# Patient Record
Sex: Female | Born: 1965 | Race: White | Hispanic: No | Marital: Married | Smoking: Never smoker
Health system: Southern US, Community
[De-identification: ages and names within clinical notes are randomized; demographics above are authoritative.]

## PROBLEM LIST (undated history)

## (undated) DIAGNOSIS — Z681 Body mass index (BMI) 19 or less, adult: Secondary | ICD-10-CM

## (undated) DIAGNOSIS — R079 Chest pain, unspecified: Secondary | ICD-10-CM

## (undated) HISTORY — PX: FOOT SURGERY: SHX648

## (undated) HISTORY — DX: Body mass index (BMI) 19.9 or less, adult: Z68.1

## (undated) HISTORY — DX: Chest pain, unspecified: R07.9

---

## 1999-07-22 ENCOUNTER — Other Ambulatory Visit: Admission: RE | Admit: 1999-07-22 | Discharge: 1999-07-22 | Payer: Self-pay | Admitting: Obstetrics & Gynecology

## 2001-04-18 ENCOUNTER — Other Ambulatory Visit: Admission: RE | Admit: 2001-04-18 | Discharge: 2001-04-18 | Payer: Self-pay | Admitting: Obstetrics & Gynecology

## 2002-05-29 ENCOUNTER — Other Ambulatory Visit: Admission: RE | Admit: 2002-05-29 | Discharge: 2002-05-29 | Payer: Self-pay | Admitting: Obstetrics & Gynecology

## 2003-08-20 ENCOUNTER — Other Ambulatory Visit: Admission: RE | Admit: 2003-08-20 | Discharge: 2003-08-20 | Payer: Self-pay | Admitting: Obstetrics & Gynecology

## 2003-08-20 ENCOUNTER — Other Ambulatory Visit: Admission: RE | Admit: 2003-08-20 | Discharge: 2003-08-20 | Payer: Self-pay | Admitting: Obstetrics and Gynecology

## 2003-11-26 ENCOUNTER — Other Ambulatory Visit: Admission: RE | Admit: 2003-11-26 | Discharge: 2003-11-26 | Payer: Self-pay | Admitting: Obstetrics & Gynecology

## 2004-03-16 ENCOUNTER — Ambulatory Visit (HOSPITAL_COMMUNITY): Admission: RE | Admit: 2004-03-16 | Discharge: 2004-03-16 | Payer: Self-pay | Admitting: Internal Medicine

## 2004-07-05 ENCOUNTER — Other Ambulatory Visit: Admission: RE | Admit: 2004-07-05 | Discharge: 2004-07-05 | Payer: Self-pay | Admitting: Obstetrics & Gynecology

## 2004-10-03 ENCOUNTER — Ambulatory Visit (HOSPITAL_COMMUNITY): Admission: RE | Admit: 2004-10-03 | Discharge: 2004-10-03 | Payer: Self-pay | Admitting: Family Medicine

## 2005-01-10 ENCOUNTER — Other Ambulatory Visit: Admission: RE | Admit: 2005-01-10 | Discharge: 2005-01-10 | Payer: Self-pay | Admitting: Obstetrics and Gynecology

## 2005-01-11 ENCOUNTER — Other Ambulatory Visit: Admission: RE | Admit: 2005-01-11 | Discharge: 2005-01-11 | Payer: Self-pay | Admitting: Obstetrics and Gynecology

## 2005-12-15 ENCOUNTER — Inpatient Hospital Stay (HOSPITAL_COMMUNITY): Admission: AD | Admit: 2005-12-15 | Discharge: 2005-12-15 | Payer: Self-pay | Admitting: Internal Medicine

## 2006-01-02 ENCOUNTER — Ambulatory Visit: Payer: Self-pay | Admitting: Internal Medicine

## 2006-02-06 ENCOUNTER — Ambulatory Visit: Payer: Self-pay | Admitting: Internal Medicine

## 2007-03-28 ENCOUNTER — Ambulatory Visit (HOSPITAL_COMMUNITY): Admission: RE | Admit: 2007-03-28 | Discharge: 2007-03-28 | Payer: Self-pay | Admitting: Family Medicine

## 2009-01-23 IMAGING — CT CT ABDOMEN W/ CM
1 of 3 series · 13 of 32 positions shown, 18 images · IV contrast (Omnipaque 300)
Comparison: 12/15/2005 CT.

CLINICAL DATA: Left lower quadrant pain, bloating, swelling.  History of diverticulitis.  Acute abdominal pain, evaluate for perforation.
ABDOMEN CT WITH CONTRAST:
TECHNIQUE: Multidetector CT imaging of the abdomen was performed following the standard protocol during bolus administration of intravenous contrast.
Contrast:  522cc Omnipaque 300 and oral contrast media.
TECHNIQUE: Multidetector CT imaging of the pelvis was performed following the standard protocol during bolus administration of intravenous contrast.

[Series 2: abd_pel 5.0 b40f · axial · 0.64mm/px · z∈[-488,-68]mm · 13 of 96 slices shown, 18 images]
[im 6/96  soft-tissue]
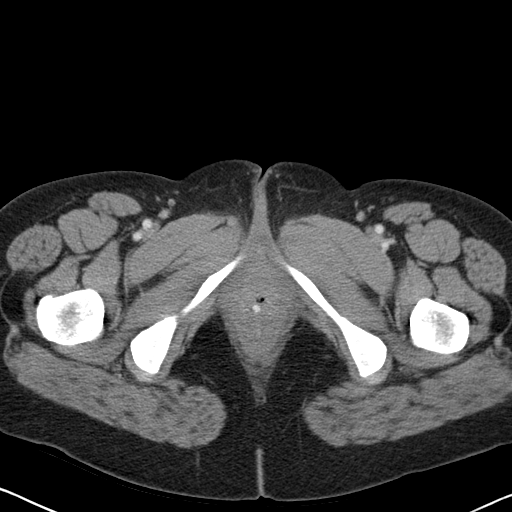
[im 6/96  bone]
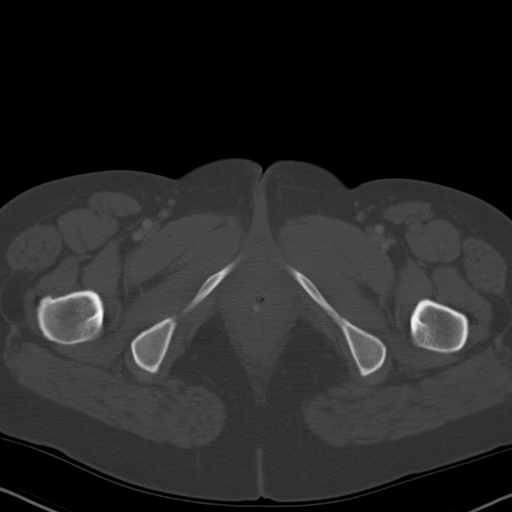
[im 16/96  soft-tissue]
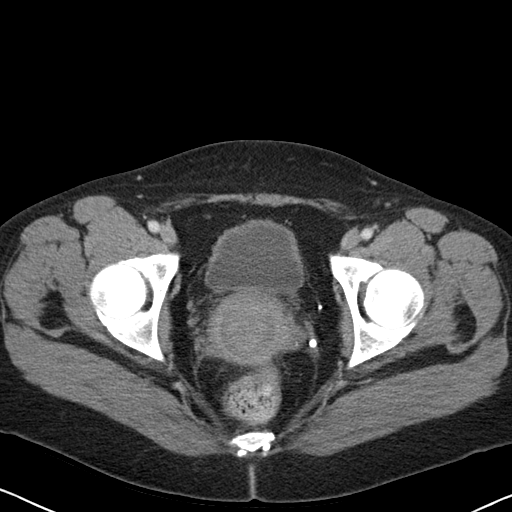
[im 22/96  soft-tissue]
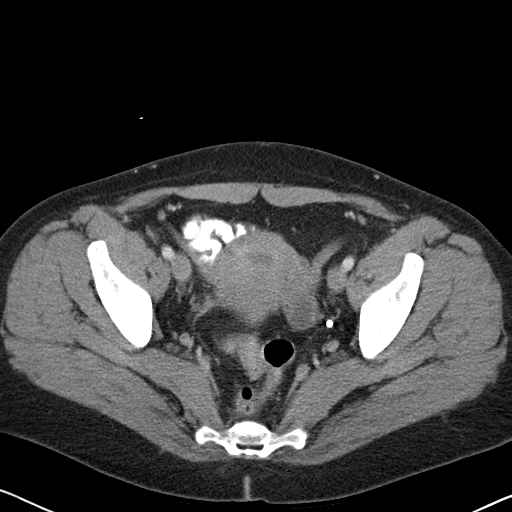
[im 27/96  soft-tissue]
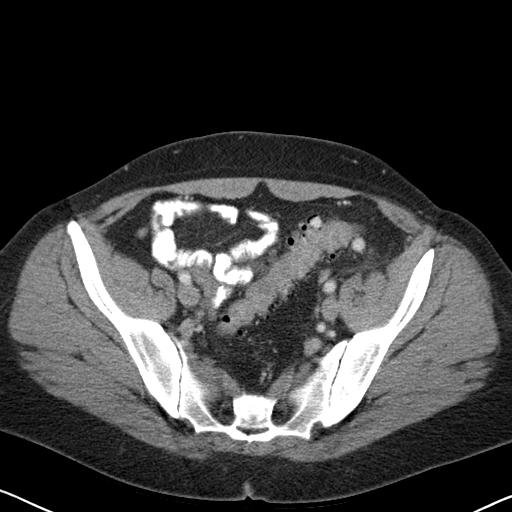
[im 37/96  soft-tissue]
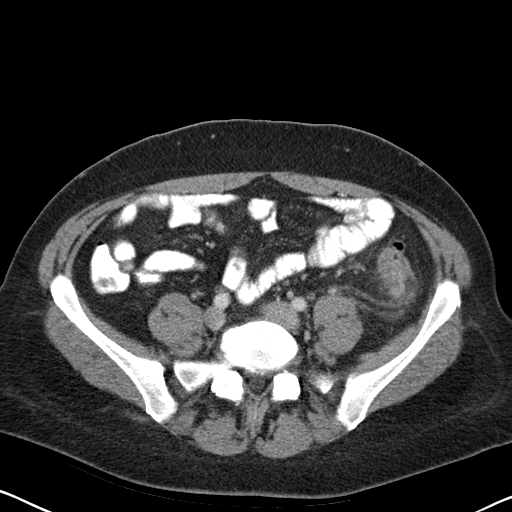
[im 43/96  soft-tissue]
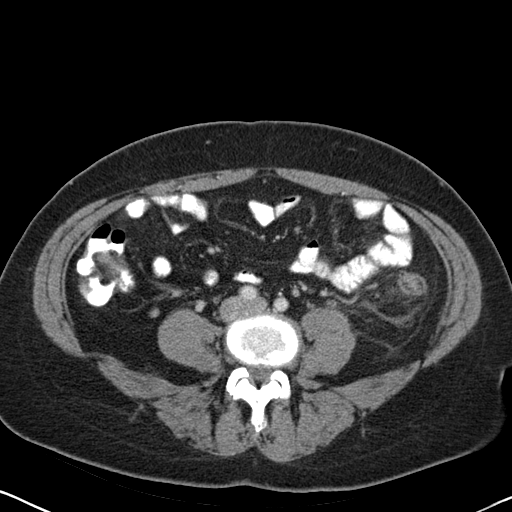
[im 53/96  soft-tissue]
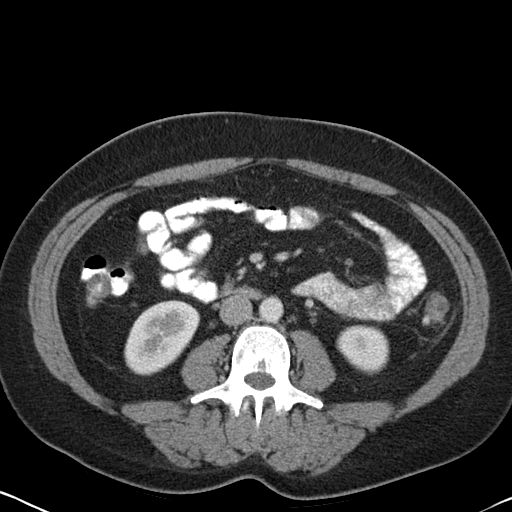
[im 59/96  soft-tissue]
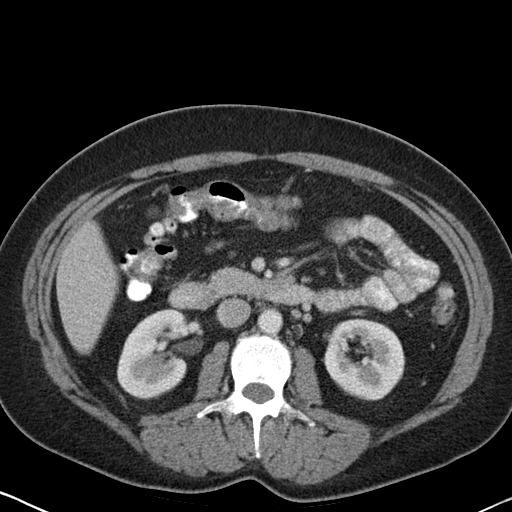
[im 69/96  soft-tissue]
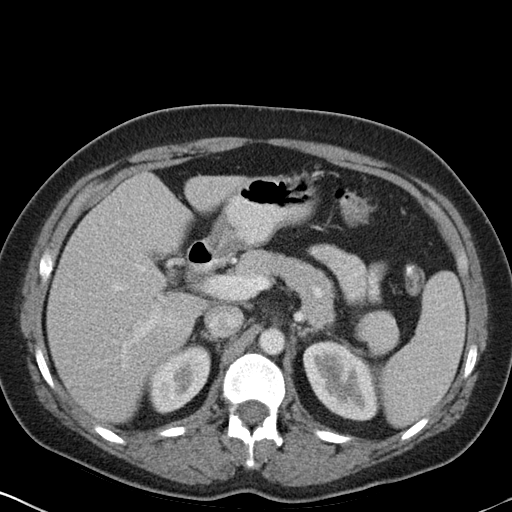
[im 69/96  bone]
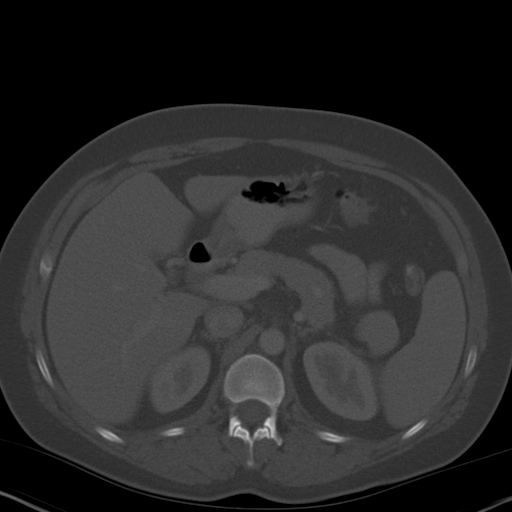
[im 74/96  soft-tissue]
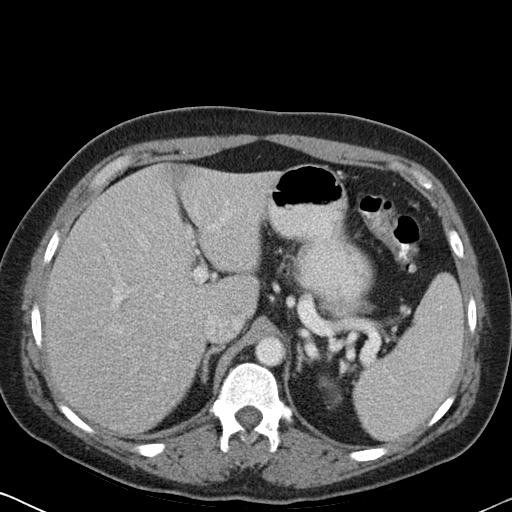
[im 74/96  lung]
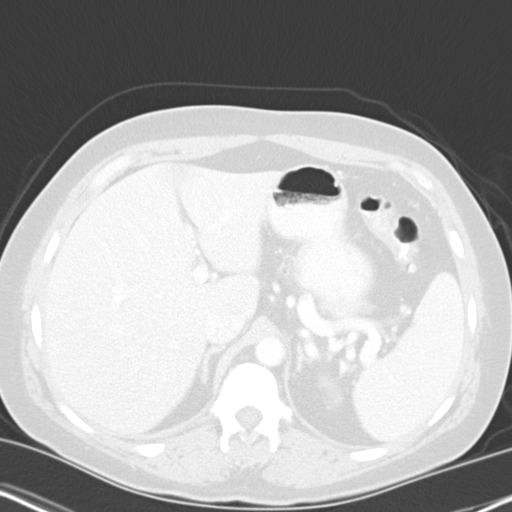
[im 80/96  soft-tissue]
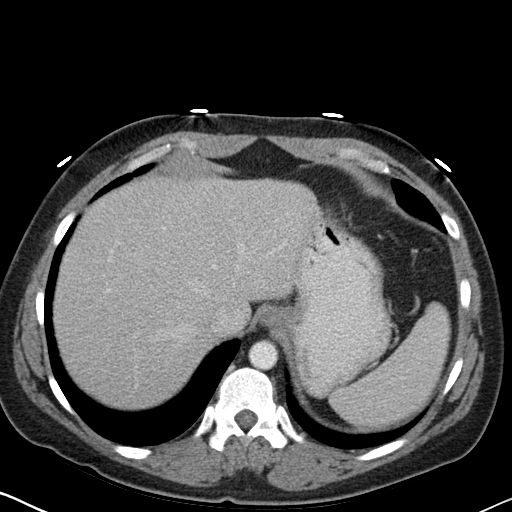
[im 80/96  lung]
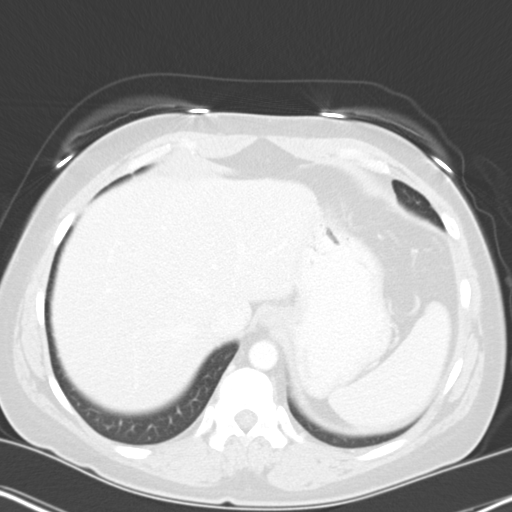
[im 85/96  lung]
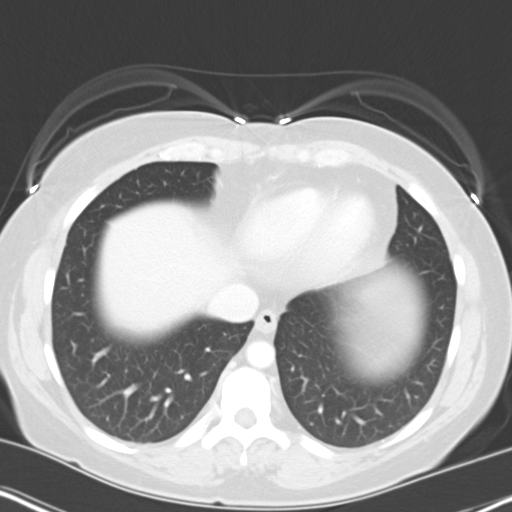
[im 90/96  soft-tissue]
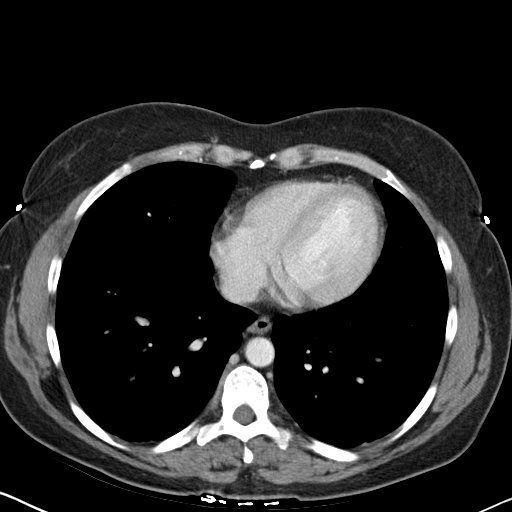
[im 90/96  lung]
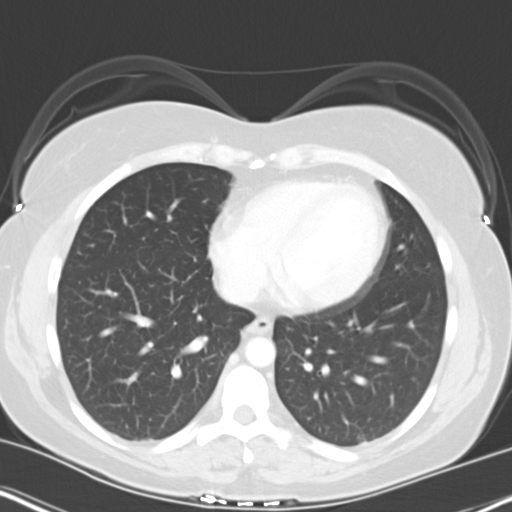

[13 of 32 positions shown; findings below may reference images not displayed]

FINDINGS: Again appreciated is 9-10mm cyst in the superior and anterior aspect of the liver and a vague focus of contrast enhancement in the inferior aspect of the right hepatic lobe measuring approximately 2.5-3.2 x 2.6cm compared to 2.2 x 2.2cm on the prior exam.  ndings compatible with acute diverticulitis involving the distal descending colon.  There is wall-thickening and pericolic edema.  There is thickening of the nearby pararenal fascia.  No evidence of a pericolic abscess or free fluid.  No free intraperitoneal air.  Scattered diverticula in the transverse descending and sigmoid colon are noted.
IMPRESSION: Acute diverticulitis of the distal descending colon.  Slight increase in size of mass in the inferior aspect of the right hepatic lobe.  Main differential diagnostic considerations are focal nodular hyperplasia and hepatic adenoma.
PELVIS CT WITH CONTRAST:
FINDINGS: Sigmoid colon diverticula.  Again appreciated is diverticulitis involving distal descending colon, the distal aspect of which is near the proximal aspect of the sigmoid colon.  1.9 x 2.4cm left ovarian cyst.  Approximate 9mm follicle/follicular cyst in the right ovary.  No free pelvic fluid.  I am concerned that there is a mass in the cervix vs. distention of the endocervical canal.  There is an inhomogeneous attenuation of this region. This could be due to blood/debris in the cervical canal, or possibly a tumor.  There appears to be slight stranding and increased vasculature surrounding the cervix.  Cervicitis would be a consideration as well.
IMPRESSION: Distal descending colon diverticulitis.  Suspicion for mass within the cervix vs. distention of endocervical canal.  Cannot exclude tumor vs. inflammatory process.  Consider pelvic ultrasound.

## 2010-08-05 NOTE — Discharge Summary (Signed)
NAME:  Whitney Cook, Whitney Cook                 ACCOUNT NO.:  [id number]   MEDICAL RECORD NO.:  [id number]          PATIENT TYPE:  INP   LOCATION:  A338                          FACILITY:  [facility]   PHYSICIAN:  Madelin Rear. Sherwood Gambler, MD  DATE OF BIRTH:  07/05/1965   DATE OF ADMISSION:  12/15/2005  DATE OF DISCHARGE:  09/28/2007LH                                 DISCHARGE SUMMARY   DISCHARGE DIAGNOSIS:  Acute diverticulitis.   DISCHARGE MEDICATIONS:  1. Levaquin 500 mg p.o. daily.  2. Cleocin 300 mg p.o. q.i.d.   SUMMARY:  The patient was admitted after evaluation as an outpatient  confirmed clinical impression of diverticulitis, treated with oral  medications.  She suffered from an adverse reaction to FLAGYL.  Pain  persisted and she was somewhat anorexic.  She was admitted for IV antibiotic  therapy and IV hydration.  Shortly after admission she felt much better  after some fluids and Zofran were given.  Also, metabolism with the Flagyl  that may have been causing her initial symptoms of dizziness and nausea.  She decided to be discharged shortly thereafter.  I concurred with this.  Follow-up in office.      Madelin Rear. Sherwood Gambler, MD  Electronically Signed     LJF/MEDQ  D:  01/14/2006  T:  01/15/2006  Job:  [id number]

## 2010-08-05 NOTE — Assessment & Plan Note (Signed)
[facility]                           GASTROENTEROLOGY OFFICE NOTE   NAME:Whitney Cook, Whitney Cook                        MRN:          [id number]  DATE:01/02/2006                            DOB:          09-12-1965   OFFICE CONSULTATION NOTE:   REFERRING PHYSICIAN:  Madelin Rear. Sherwood Gambler, MD   REASON FOR CONSULTATION:  1. Diverticulitis.  2. Liver lesions on CT scan.   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 Sherwood Gambler  regarding recent problems with diverticulitis as well as hepatic  abnormalities on CT scan imaging. The patient has no significant medical  problems. She was well until last month when she developed problems with  left chest pain, right flank pain and ultimately generalized abdominal  discomfort. She described nausea and bloating. She was evaluated December 11, 2005. CBC, comprehensive metabolic panel, amylase, lipase and urinalysis  were unrevealing. A CT scan of the abdomen and pelvis revealed mild  diverticulitis in the region of the splenic flexure as well. A small hepatic  cyst in the right liver. She was treated with ciprofloxacin and Flagyl. She  developed drug intolerance in the form of nausea and vomiting, nonresponsive  to Phenergan. Medications were changed to Cleocin and Levaquin. Still with  symptoms necessitating a short course of intravenous therapy. Followup CT  scan was performed December 15, 2005. This again revealed diverticulitis in  the region of the splenic flexure without complicating features. A tiny cyst  in the left lobe of the liver was noted. In addition, she was said to have  an enhancing lesion in the right lobe of the liver felt to represent either  an adenoma or focal nodular hyperplasia. This was said to measure 2.2 x 2.2  cm. MRI of the liver was recommended. This was performed December 28, 2005 at  [facility]. A tiny 6 mm cyst in the left liver was noted. However, no  discernable lesion in the right liver as noted on CT could identified. The  patient completed a course of antimicrobial therapy and for the most part  has improved. She denies currently any abdominal pain or fevers. Occasional  discomfort with meals. She has not had prior GI evaluations. No prior  history of the same. Denies rectal bleeding or unexplained weight loss.   PAST MEDICAL HISTORY:  None.   PAST SURGICAL HISTORY:  None.   CURRENT MEDICATIONS:  Birth control pills.   ALLERGIES:  Intolerant to ORAL FLAGYL.   FAMILY HISTORY:  No family history of gastrointestinal malignancy.  Grandfather with diverticulitis. Father with heart disease and diabetes.   SOCIAL HISTORY:  The patient is married with one son. She lives with her  husband. She attended college for one year. She is the Print production planner at  [employer]. She does not smoke or use alcohol.   REVIEW OF SYSTEMS:  Per diagnostic evaluation form.   PHYSICAL EXAMINATION:  GENERAL:  Well-appearing female in no acute distress.  VITAL SIGNS:  Blood pressure is 122/80,  heart rate 60 and regular, weighs  169.2 pounds. She is 5 feet 7 inches in height.  HEENT:  Sclera anicteric, conjunctiva pink, oral mucosa intact, no  adenopathy.  LUNGS:  Clear.  HEART:  Regular.  ABDOMEN:  Soft with some tenderness in the left mid abdomen to palpation.  Good bowel sounds heard, no mass or rebound.  EXTREMITIES:  Without edema.   IMPRESSION:  1. Acute diverticulitis. Significantly improved after antibiotic therapy.      Some residual tenderness on physical examination today as well as some      postprandial pain continuing.  2. Hepatic abnormalities on CT scan. It appears that she has an incident      small benign cyst in the left liver and possibly the right liver as      well. However, lesion eluded to on her second CT scan not seen on the      MRI  probably represents focal nodular hyperplasia given the      indiscernability on MRI. Will review this with our radiologist in the      near future.   RECOMMENDATIONS:  1. Additional antibiotics for what seems to be residual mild      diverticulitis. Will prescribe Augmentin 875 mg b.i.d. for 1 week.  2. Schedule colonoscopy about 3-4 weeks after complete recovery from      diverticular episode. This to exclude other problems masquerading as      diverticulitis. The nature of the procedure as well as the risks,      benefits, and alternatives have been reviewed. She understood and      agreed to proceed. The exam has been set for November 20 at 9:30 a.m.  3. Review CT scan with radiologist.            ______________________________  Wilhemina Bonito. Eda Keys., MD     JNP/MedQ  DD:  01/08/2006  DT:  01/09/2006  Job #:  [id number]   cc:   Madelin Rear. Sherwood Gambler, MD

## 2010-08-05 NOTE — H&P (Signed)
NAME:  Whitney Cook, Whitney Cook                 ACCOUNT NO.:  [id number]   MEDICAL RECORD NO.:  [id number]          PATIENT TYPE:  INP   LOCATION:  A338                          FACILITY:  [facility]   PHYSICIAN:  Madelin Rear. Sherwood Gambler, MD  DATE OF BIRTH:  11-14-1965   DATE OF ADMISSION:  12/15/2005  DATE OF DISCHARGE:  LH                                HISTORY & PHYSICAL   CHIEF COMPLAINT:  Abdominal pain.   HISTORY OF PRESENT ILLNESS:  The patient had a recent outpatient diagnosis  made of mild diverticulitis.  Over the previous 48 hours, she has been  treated with oral regimens including Flagyl plus Cipro changed to Cleocin  plus Levaquin secondary to intolerance to Flagyl.  She has had intermittent  vertigo, nausea, vomiting, and abdominal pain.  No fever, rigors, or chills.  She has, however, had progressive increase in her left lower quadrant  abdominal pain in spite of adequate outpatient oral therapy.   PAST MEDICAL HISTORY:  Noncontributory.  She has had migraines and previous  episodes of severe vertigo.   SOCIAL HISTORY:  Nonsmoker and nondrinker.  She is our Print production planner.   FAMILY HISTORY:  Noncontributory.   REVIEW OF SYSTEMS:  Under HPI.  No hematemesis, hematochezia, or melena.   PHYSICAL EXAMINATION:  GENERAL:  Appears distressed and in moderate illness  but not acute.  HEAD/NECK:  No JVD or adenopathy.  Neck supple.  CHEST:  Clear.  CARDIAC:  Regular rhythm.  No murmur, gallop, or rub.  ABDOMEN:  Positive left lower quadrant abdominal tenderness.  No guarding or  rebound.  No organomegaly or masses.  EXTREMITIES:  Without clubbing, cyanosis, or edema.  NEURO:  Nonfocal.   IMPRESSION:  Acute diverticulitis with increased symptoms in spite of  adequate outpatient oral therapy.  I will start her on intravenous  medications and rescan to rule out perforation or abscess.  Possible  surgical intervention as indicated.      Madelin Rear. Sherwood Gambler, MD  Electronically Signed    LJF/MEDQ  D:  12/15/2005  T:  12/15/2005  Job:  [id number]

## 2010-10-04 ENCOUNTER — Ambulatory Visit (HOSPITAL_COMMUNITY): Admission: RE | Admit: 2010-10-04 | Payer: Self-pay | Source: Ambulatory Visit | Admitting: Obstetrics and Gynecology

## 2010-10-04 ENCOUNTER — Encounter (HOSPITAL_COMMUNITY): Admission: RE | Payer: Self-pay | Source: Ambulatory Visit

## 2010-10-04 SURGERY — LAPAROSCOPY WITH TUBAL LIGATION
Anesthesia: General

## 2012-03-11 ENCOUNTER — Other Ambulatory Visit: Payer: Self-pay | Admitting: Endocrinology

## 2012-03-11 DIAGNOSIS — E010 Iodine-deficiency related diffuse (endemic) goiter: Secondary | ICD-10-CM

## 2012-03-15 ENCOUNTER — Ambulatory Visit
Admission: RE | Admit: 2012-03-15 | Discharge: 2012-03-15 | Disposition: A | Discharge status: Home / Self Care | Payer: BC Managed Care – PPO | Source: Ambulatory Visit | Attending: Endocrinology | Admitting: Endocrinology

## 2012-03-15 DIAGNOSIS — E010 Iodine-deficiency related diffuse (endemic) goiter: Secondary | ICD-10-CM

## 2012-07-11 ENCOUNTER — Emergency Department (HOSPITAL_COMMUNITY)
Admission: EM | Admit: 2012-07-11 | Discharge: 2012-07-12 | Disposition: A | Discharge status: Home / Self Care | Payer: BC Managed Care – PPO | Attending: Emergency Medicine | Admitting: Emergency Medicine

## 2012-07-11 ENCOUNTER — Encounter (HOSPITAL_COMMUNITY): Payer: Self-pay | Admitting: *Deleted

## 2012-07-11 DIAGNOSIS — S139XXA Sprain of joints and ligaments of unspecified parts of neck, initial encounter: Secondary | ICD-10-CM | POA: Insufficient documentation

## 2012-07-11 DIAGNOSIS — Y9389 Activity, other specified: Secondary | ICD-10-CM | POA: Insufficient documentation

## 2012-07-11 DIAGNOSIS — Z7982 Long term (current) use of aspirin: Secondary | ICD-10-CM | POA: Insufficient documentation

## 2012-07-11 DIAGNOSIS — S4980XA Other specified injuries of shoulder and upper arm, unspecified arm, initial encounter: Secondary | ICD-10-CM | POA: Insufficient documentation

## 2012-07-11 DIAGNOSIS — Y92410 Unspecified street and highway as the place of occurrence of the external cause: Secondary | ICD-10-CM | POA: Insufficient documentation

## 2012-07-11 DIAGNOSIS — S46909A Unspecified injury of unspecified muscle, fascia and tendon at shoulder and upper arm level, unspecified arm, initial encounter: Secondary | ICD-10-CM | POA: Insufficient documentation

## 2012-07-11 MED ORDER — METHOCARBAMOL 500 MG PO TABS: 1000.0000 mg | ORAL_TABLET | Freq: Four times a day (QID) | ORAL | Status: DC

## 2012-07-11 MED ORDER — METHOCARBAMOL 500 MG PO TABS
1000.0000 mg | ORAL_TABLET | Freq: Once | ORAL | Status: AC
  Administered 2012-07-11: 1000 mg via ORAL
  Filled 2012-07-11: qty 2

## 2012-07-11 MED ORDER — IBUPROFEN 400 MG PO TABS
600.0000 mg | ORAL_TABLET | Freq: Once | ORAL | Status: AC
  Administered 2012-07-11: 600 mg via ORAL
  Filled 2012-07-11: qty 2

## 2012-07-11 MED ORDER — HYDROCODONE-ACETAMINOPHEN 5-325 MG PO TABS: 1.0000 | ORAL_TABLET | ORAL | Status: DC | PRN

## 2012-07-11 MED ORDER — IBUPROFEN 600 MG PO TABS: 600.0000 mg | ORAL_TABLET | Freq: Three times a day (TID) | ORAL | Status: DC | PRN

## 2012-07-11 MED ORDER — HYDROCODONE-ACETAMINOPHEN 5-325 MG PO TABS
1.0000 | ORAL_TABLET | Freq: Once | ORAL | Status: AC
  Administered 2012-07-11: 1 via ORAL
  Filled 2012-07-11: qty 1
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
## 2012-07-16 NOTE — ED Provider Notes (Signed)
Medical screening examination/treatment/procedure(s) were performed by non-physician practitioner and as supervising physician I was immediately available for consultation/collaboration. Todrick Siedschlag, MD, FACEP   Latiesha Harada L Samai Corea, MD 07/16/12 0006 

## 2012-08-03 ENCOUNTER — Encounter (HOSPITAL_COMMUNITY): Payer: Self-pay | Admitting: *Deleted

## 2012-08-03 ENCOUNTER — Emergency Department (HOSPITAL_COMMUNITY): Payer: BC Managed Care – PPO

## 2012-08-03 ENCOUNTER — Emergency Department (HOSPITAL_COMMUNITY)
Admission: EM | Admit: 2012-08-03 | Discharge: 2012-08-03 | Disposition: A | Discharge status: Home / Self Care | Payer: BC Managed Care – PPO | Attending: Emergency Medicine | Admitting: Emergency Medicine

## 2012-08-03 DIAGNOSIS — R071 Chest pain on breathing: Secondary | ICD-10-CM | POA: Insufficient documentation

## 2012-08-03 DIAGNOSIS — Z87828 Personal history of other (healed) physical injury and trauma: Secondary | ICD-10-CM | POA: Insufficient documentation

## 2012-08-03 DIAGNOSIS — M79602 Pain in left arm: Secondary | ICD-10-CM

## 2012-08-03 DIAGNOSIS — Z7982 Long term (current) use of aspirin: Secondary | ICD-10-CM | POA: Insufficient documentation

## 2012-08-03 DIAGNOSIS — Z79899 Other long term (current) drug therapy: Secondary | ICD-10-CM | POA: Insufficient documentation

## 2012-08-03 DIAGNOSIS — R0789 Other chest pain: Secondary | ICD-10-CM

## 2012-08-03 DIAGNOSIS — M79609 Pain in unspecified limb: Secondary | ICD-10-CM | POA: Insufficient documentation

## 2012-08-03 LAB — CBC WITH DIFFERENTIAL/PLATELET
Basophils Absolute: 0 10*3/uL (ref 0.0–0.1)
Basophils Relative: 0 % (ref 0–1)
Eosinophils Absolute: 0.2 10*3/uL (ref 0.0–0.7)
Eosinophils Relative: 2 % (ref 0–5)
HCT: 39.6 % (ref 36.0–46.0)
Hemoglobin: 14.2 g/dL (ref 12.0–15.0)
Lymphocytes Relative: 18 % (ref 12–46)
Lymphs Abs: 1.7 10*3/uL (ref 0.7–4.0)
MCH: 31.3 pg (ref 26.0–34.0)
MCHC: 35.9 g/dL (ref 30.0–36.0)
MCV: 87.2 fL (ref 78.0–100.0)
Monocytes Absolute: 0.7 10*3/uL (ref 0.1–1.0)
Monocytes Relative: 7 % (ref 3–12)
Neutro Abs: 7 10*3/uL (ref 1.7–7.7)
Platelets: 196 10*3/uL (ref 150–400)
RBC: 4.54 MIL/uL (ref 3.87–5.11)
RDW: 12 % (ref 11.5–15.5)

## 2012-08-03 LAB — COMPREHENSIVE METABOLIC PANEL
AST: 20 U/L (ref 0–37)
Albumin: 4.1 g/dL (ref 3.5–5.2)
Alkaline Phosphatase: 57 U/L (ref 39–117)
CO2: 27 mEq/L (ref 19–32)
Calcium: 9.7 mg/dL (ref 8.4–10.5)
Chloride: 105 mEq/L (ref 96–112)
Creatinine, Ser: 0.88 mg/dL (ref 0.50–1.10)
GFR calc Af Amer: 90 mL/min — ABNORMAL LOW (ref >90)
GFR calc non Af Amer: 78 mL/min — ABNORMAL LOW (ref >90)
Glucose, Bld: 106 mg/dL — ABNORMAL HIGH (ref 70–99)
Sodium: 140 mEq/L (ref 135–145)
Total Bilirubin: 0.3 mg/dL (ref 0.3–1.2)
Total Protein: 6.8 g/dL (ref 6.0–8.3)

## 2012-08-03 LAB — POCT I-STAT TROPONIN I: Troponin i, poc: 0 ng/mL (ref 0.00–0.08)

## 2012-08-03 MED ORDER — KETOROLAC TROMETHAMINE 30 MG/ML IJ SOLN
30.0000 mg | Freq: Once | INTRAMUSCULAR | Status: AC
  Administered 2012-08-03: 30 mg via INTRAVENOUS
  Filled 2012-08-03: qty 1

## 2012-08-03 MED ORDER — DIAZEPAM 5 MG PO TABS
5.0000 mg | ORAL_TABLET | Freq: Once | ORAL | Status: AC
  Administered 2012-08-03: 5 mg via ORAL
  Filled 2012-08-03: qty 1

## 2012-08-03 NOTE — ED Notes (Addendum)
Pt states that she started having CP last night prior to going to bed. Pt states she took 2 baby aspirin at 22:30. Pt states that her left arm and left shoulder and radiation to her left arm were also hurting. Pt states that unable to fall asleep.

## 2012-08-03 NOTE — ED Notes (Signed)
Family at bedside. 
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
## 2013-01-23 ENCOUNTER — Ambulatory Visit: Payer: Self-pay | Admitting: Podiatry

## 2013-01-29 ENCOUNTER — Ambulatory Visit (INDEPENDENT_AMBULATORY_CARE_PROVIDER_SITE_OTHER): Payer: BC Managed Care – PPO

## 2013-01-29 ENCOUNTER — Ambulatory Visit (INDEPENDENT_AMBULATORY_CARE_PROVIDER_SITE_OTHER): Payer: BC Managed Care – PPO | Admitting: Podiatry

## 2013-01-29 ENCOUNTER — Encounter: Payer: Self-pay | Admitting: Podiatry

## 2013-01-29 VITALS — Ht 67.0 in | Wt 170.0 lb

## 2013-01-29 DIAGNOSIS — R52 Pain, unspecified: Secondary | ICD-10-CM

## 2013-01-29 DIAGNOSIS — M2010 Hallux valgus (acquired), unspecified foot: Secondary | ICD-10-CM

## 2013-01-29 DIAGNOSIS — M7750 Other enthesopathy of unspecified foot: Secondary | ICD-10-CM

## 2013-01-29 MED ORDER — TRIAMCINOLONE ACETONIDE 10 MG/ML IJ SUSP
5.0000 mg | Freq: Once | INTRAMUSCULAR | Status: AC
  Administered 2013-01-29: 5 mg via INTRA_ARTICULAR

## 2013-01-29 NOTE — Progress Notes (Signed)
Subjective:     Patient ID: Whitney Cook, female   DOB: 1965/10/16, 47 y.o.   MRN: [id number]  Foot Pain   patient states for the last 6 weeks I have been getting some pain in top of my right foot and I also know I need to get my bunion fixed on my left foot. Patient is approximately 18 months after foot surgery right   Review of Systems  All other systems reviewed and are negative.       Objective:   Physical Exam  Nursing note and vitals reviewed. Constitutional: She is oriented to person, place, and time.  Cardiovascular: Intact distal pulses.   Musculoskeletal: Normal range of motion.  Neurological: She is oriented to person, place, and time.  Skin: Skin is warm.   patient is found to have pain on the dorsum of the foot around the second metatarsal base and midtarsal joint right and has structural bunion deformity left that is red and painful on the medial side. Incision sites are all well-healed on the right f and neurovascular status found to be intact with normal muscle strengthoot     Assessment:     Tendinitis dorsal right with possibility of irritation from screws and HAV deformity left     Plan:     H&P and x-rays performed. Injection right dorsal foot 3 mg Kenalog 5 mg Xylocaine Marcaine mixture and for the left foot discussed bunion correction. Patient wants this done and we'll be able to have Austin type bunionectomy which was discussed and explained the patient. We reviewed all possible complications with surgery and the fact that total recovery. We'll take 6 months to one year. Patient will call that day and was given all preoperative instructions at this time

## 2013-01-29 NOTE — Patient Instructions (Signed)
Pre-Operative Instructions  Congratulations, you have decided to take an important step to improving your quality of life.  You can be assured that the doctors of [facility] will be with you every step of the way.  1. Plan to be at the surgery center/hospital at least 1 (one) hour prior to your scheduled time unless otherwise directed by the surgical center/hospital staff.  You must have a responsible adult accompany you, remain during the surgery and drive you home.  Make sure you have directions to the surgical center/hospital and know how to get there on time. 2. For hospital based surgery you will need to obtain a history and physical form from your family physician within 1 month prior to the date of surgery- we will give you a form for you primary physician.  3. We make every effort to accommodate the date you request for surgery.  There are however, times where surgery dates or times have to be moved.  We will contact you as soon as possible if a change in schedule is required.   4. No Aspirin/Ibuprofen for one week before surgery.  If you are on aspirin, any non-steroidal anti-inflammatory medications (Mobic, Aleve, Ibuprofen) you should stop taking it 7 days prior to your surgery.  You make take Tylenol  For pain prior to surgery.  5. Medications- If you are taking daily heart and blood pressure medications, seizure, reflux, allergy, asthma, anxiety, pain or diabetes medications, make sure the surgery center/hospital is aware before the day of surgery so they may notify you which medications to take or avoid the day of surgery. 6. No food or drink after midnight the night before surgery unless directed otherwise by surgical center/hospital staff. 7. No alcoholic beverages 24 hours prior to surgery.  No smoking 24 hours prior to or 24 hours after surgery. 8. Wear loose pants or shorts- loose enough to fit over bandages, boots, and casts. 9. No slip on shoes, sneakers are best. 10. Bring  your boot with you to the surgery center/hospital.  Also bring crutches or a walker if your physician has prescribed it for you.  If you do not have this equipment, it will be provided for you after surgery. 11. If you have not been contracted by the surgery center/hospital by the day before your surgery, call to confirm the date and time of your surgery. 12. Leave-time from work may vary depending on the type of surgery you have.  Appropriate arrangements should be made prior to surgery with your employer. 13. Prescriptions will be provided immediately following surgery by your doctor.  Have these filled as soon as possible after surgery and take the medication as directed. 14. Remove nail polish on the operative foot. 15. Wash the night before surgery.  The night before surgery wash the foot and leg well with the antibacterial soap provided and water paying special attention to beneath the toenails and in between the toes.  Rinse thoroughly with water and dry well with a towel.  Perform this wash unless told not to do so by your physician.  Enclosed: 1 Ice pack (please put in freezer the night before surgery)   1 Hibiclens skin cleaner   Pre-op Instructions  If you have any questions regarding the instructions, do not hesitate to call our office.  [place]: [address] [place], [place] [address] [phone]  [place]: [address] [place], [place] [address] [phone]  [place]: [address]  , [place] [address] [phone]  Dr. Richard   Tuchman DPM, Dr. Norman Regal DPM Dr. Richard Sikora DPM, Dr. M. Todd Hyatt DPM, Dr. Kathryn Egerton DPM   Bunionectomy A bunionectomy is surgery to remove a bunion. A bunion is an enlargement of the joint at the base of the big toe. It is made up of bone and soft tissue on the inside part of the joint. Over time, a painful lump appears on the inside of the joint. The big toe begins to point inward toward the second toe. New bone growth can occur  and a bone spur may form. The pain eventually causes difficulty walking. A bunion usually results from inflammation caused by the irritation of poorly fitting shoes. It often begins later in life. A bunionectomy is performed when nonsurgical treatment no longer works. When surgery is needed, the extent of the procedure will depend on the degree of deformity of the foot. Your surgeon will discuss with you the different procedures and what will work best for you depending on your age and health. LET YOUR CAREGIVER KNOW ABOUT:   Previous problems with anesthetics or medicines used to numb the skin.  Allergies to dyes, iodine, foods, and/or latex.  Medicines taken including herbs, eye drops, prescription medicines (especially medicines used to "thin the blood"), aspirin and other over-the-counter medicines, and steroids (by mouth or as a cream).  History of bleeding or blood problems.  Possibility of pregnancy, if this applies.  History of blood clots in your legs and/or lungs .  Previous surgery.  Other important health problems. RISKS AND COMPLICATIONS   Infection.  Pain.  Nerve damage.  Possibility that the bunion will recur. BEFORE THE PROCEDURE  You should be present 60 minutes prior to your procedure or as directed.  PROCEDURE  Surgery is often done so that you can go home the same day (outpatient). It may be done in a hospital or in an outpatient surgical center. An anesthetic will be used to help you sleep during the procedure. Sometimes, a spinal anesthetic is used to make you numb below the waist. A cut (incision) is made over the swollen area at the first joint of the big toe. The enlarged lump will be removed. If there is a need to reposition the bones of the big toe, this may require more than 1 incision. The bone itself may need to be cut. Screws and wires may be used in the repair. These can be removed at a later date. In severe cases, the entire joint may need to be removed  and a joint replacement inserted. When done, the incision is closed with stitches (sutures). Skin adhesive strips may be added for reinforcement. They help hold the incision closed.  AFTER THE PROCEDURE  Compression bandages (dressings) are then wrapped around the wound. This helps to keep the foot in alignment and reduce swelling. Your foot will be monitored for bleeding and swelling. You will need to stay for a few hours in the recovery area before being discharged. This allows time for the anesthesia to wear off. You will be discharged home when you are awake, stable, and doing well. HOME CARE INSTRUCTIONS   You can expect to return to normal activities within 6 to 8 weeks after surgery. The foot is at increased risk for swelling for several months. When you can expect to bear weight on the operated foot will depend on the extent of your surgery. The milder the deformity, the less tissue is removed and the sooner the return to normal activity level. During   the recovery period, a special shoe, boot, or cast may be worn to accommodate the surgical bandage and to help provide stability to the foot.  Once you are home, an ice pack applied to the operative site may help with discomfort and keep swelling down. Stop using the ice if it causes discomfort.  Keep your feet raised (elevated) when possible to lessen swelling.  If you have an elastic bandage on your foot and you have numbness, tingling, or your foot becomes cold and blue, adjust the bandage to make it comfortable.  Change dressings as directed.  Keep the wound dry and clean. The wound may be washed gently with soap and water. Gently blot dry without rubbing. Do not take baths or use swimming pools or hot tubs for 10 days, or as instructed by your caregiver.  Only take over-the-counter or prescription medicines for pain, discomfort, or fever as directed by your caregiver.  You may continue a normal diet as directed.  For activity, use  crutches with no weight bearing or your orthopedic shoe as directed. Continue to use crutches or a cane as directed until you can stand without causing pain. SEEK MEDICAL CARE IF:   You have redness, swelling, bruising, or increasing pain in the wound.  There is pus coming from the wound.  You have drainage from a wound lasting longer than 1 day.  You have an oral temperature above 102 F (38.9 C).  You notice a bad smell coming from the wound or dressing.  The wound breaks open after sutures have been removed.  You develop dizzy episodes or fainting while standing.  You have persistent nausea or vomiting.  Your toes become cold.  Pain is not relieved with medicines. SEEK IMMEDIATE MEDICAL CARE IF:   You develop a rash.  You have difficulty breathing.  You develop any reaction or side effects to medicines given.  Your toes are numb or blue, or you have severe pain. MAKE SURE YOU:   Understand these instructions.  Will watch your condition.  Will get help right away if you are not doing well or get worse. Document Released: 02/17/2005 Document Revised: 05/29/2011 Document Reviewed: 03/25/2007 [employer] Patient Information 2014 [employer].  

## 2013-07-24 ENCOUNTER — Other Ambulatory Visit (HOSPITAL_COMMUNITY): Payer: Self-pay | Admitting: Physician Assistant

## 2013-07-24 DIAGNOSIS — R229 Localized swelling, mass and lump, unspecified: Secondary | ICD-10-CM

## 2013-07-25 ENCOUNTER — Ambulatory Visit (HOSPITAL_COMMUNITY)
Admission: RE | Admit: 2013-07-25 | Discharge: 2013-07-25 | Disposition: A | Discharge status: Home / Self Care | Payer: BC Managed Care – PPO | Source: Ambulatory Visit | Attending: Physician Assistant | Admitting: Physician Assistant

## 2013-07-25 DIAGNOSIS — R229 Localized swelling, mass and lump, unspecified: Secondary | ICD-10-CM

## 2013-07-25 DIAGNOSIS — R609 Edema, unspecified: Secondary | ICD-10-CM | POA: Insufficient documentation

## 2013-07-25 DIAGNOSIS — M79609 Pain in unspecified limb: Secondary | ICD-10-CM | POA: Insufficient documentation

## 2013-07-25 DIAGNOSIS — I8390 Asymptomatic varicose veins of unspecified lower extremity: Secondary | ICD-10-CM | POA: Insufficient documentation

## 2013-07-25 DIAGNOSIS — I809 Phlebitis and thrombophlebitis of unspecified site: Secondary | ICD-10-CM | POA: Insufficient documentation

## 2013-11-26 ENCOUNTER — Telehealth: Payer: Self-pay | Admitting: *Deleted

## 2013-11-26 NOTE — Telephone Encounter (Signed)
I'm trying to schedule a foot surgery that I been trying to get scheduled for almost a year around my work schedule.  See if I can get on the schedule.  Give me a call back, thank you.

## 2013-11-28 NOTE — Telephone Encounter (Signed)
I need to see about getting on Dr. Beverlee Nims foot surgery schedule for the second Tuesday in November.  I've had a hard time scheduling it around my work and I know that is an availability that I have.  So please give me a call.

## 2013-12-02 NOTE — Telephone Encounter (Signed)
I returned her call and asked when she would like to schedule surgery.  She said she would like to do it in November.  I told her he does surgery on Tuesdays.  She asked to be scheduled on 01/27/2014.  I informed her she would probably need to come in again for another consultation since it's been so long ago that she saw him.  I transferred her to a scheduler.

## 2013-12-08 ENCOUNTER — Ambulatory Visit (INDEPENDENT_AMBULATORY_CARE_PROVIDER_SITE_OTHER): Payer: BC Managed Care – PPO | Admitting: Podiatry

## 2013-12-08 ENCOUNTER — Encounter: Payer: Self-pay | Admitting: Podiatry

## 2013-12-08 VITALS — BP 141/85 | HR 78 | Resp 16

## 2013-12-08 DIAGNOSIS — M2010 Hallux valgus (acquired), unspecified foot: Secondary | ICD-10-CM

## 2013-12-09 NOTE — Progress Notes (Signed)
Subjective:     Patient ID: Whitney Cook, female   DOB: [birth year], 48 y.o.   MRN: [id number]  HPI patient states that ;m ready to get this bunion fixed on my left foot   Review of Systems     Objective:   Physical Exam Neurovascular status intact with muscle strength adequate and range of motion subtalar midtarsal joint within normal limits. Patient has a red   bone deformity on the first metatarsal left when palpated is very painful. Assessment:     HAV deformity left with symptoms    Plan:     H&P and condition reviewed. I allowed the patient to read the consent form today and answer all questions concerning the procedure and complications as listed. Patient understands the told recovery for this take 6 months to one year and that there is no long-term guarantees and patient wants surgery. Signed consent form and is given all preoperative instructions

## 2014-01-27 ENCOUNTER — Encounter: Payer: Self-pay | Admitting: Podiatry

## 2014-01-28 ENCOUNTER — Telehealth: Payer: Self-pay | Admitting: *Deleted

## 2014-01-29 NOTE — Telephone Encounter (Signed)
I was on Dr. Beverlee Nimsegal's surgery schedule for yesterday but was sick.  He was going to call the office and have me added for Tuesday the 17th.  I just wanted to confirm.  Call and let me know that is scheduled and what time that will be.  If you would give me a call.  I called and left her a message that she is scheduled for surgery on 02/03/2014.  It will be that morning, surgical center will call a day or two before and let you know exactly what time to be there.  Call if you have any further questions.

## 2014-02-03 ENCOUNTER — Encounter: Payer: BC Managed Care – PPO | Admitting: Podiatry

## 2014-02-03 DIAGNOSIS — M2012 Hallux valgus (acquired), left foot: Secondary | ICD-10-CM

## 2014-02-04 ENCOUNTER — Telehealth: Payer: Self-pay

## 2014-02-04 NOTE — Telephone Encounter (Signed)
  Left message for patient to call with questions or concerns regarding post operative status 

## 2014-02-05 ENCOUNTER — Telehealth: Payer: Self-pay | Admitting: *Deleted

## 2014-02-05 NOTE — Progress Notes (Signed)
Dr Charlsie Merlesegal performed a left Austin bunion repair on 01/27/14

## 2014-02-05 NOTE — Telephone Encounter (Addendum)
Pt states she has questions about her surgery.  Left message to call again with concerns.  Pt called 409pm states she missed my call this morning.  I called pt [phone], she asked if she need an antibiotic and if she needed to do anything with the dressing.  I told her she did not need an oral antibiotic and to leave the dressing as it is, call if the dressing became soiled call our office. Pt agreed.

## 2014-02-06 ENCOUNTER — Ambulatory Visit: Payer: BC Managed Care – PPO

## 2014-02-06 DIAGNOSIS — M21612 Bunion of left foot: Secondary | ICD-10-CM

## 2014-02-06 NOTE — Progress Notes (Signed)
Pt presents post op DOS 02/03/14 Austin bunion repair. States that she got her dressing wet while taking a shower. Removed old wet dressing, cleaned suture line with betadine, applied sterile dry gauze dressing with coflex. No s/s of infection noted, no drainage from wound, mild edema and erythema of left foot. Wound edges aligned and approximated. Advised patient to keep dressing dry and keep her appointment for next week

## 2014-02-09 ENCOUNTER — Encounter: Payer: Self-pay | Admitting: Podiatry

## 2014-02-09 ENCOUNTER — Ambulatory Visit (INDEPENDENT_AMBULATORY_CARE_PROVIDER_SITE_OTHER): Payer: BC Managed Care – PPO | Admitting: Podiatry

## 2014-02-09 ENCOUNTER — Ambulatory Visit (INDEPENDENT_AMBULATORY_CARE_PROVIDER_SITE_OTHER): Payer: BC Managed Care – PPO

## 2014-02-09 VITALS — BP 129/73 | HR 86 | Resp 16

## 2014-02-09 DIAGNOSIS — M2012 Hallux valgus (acquired), left foot: Secondary | ICD-10-CM

## 2014-02-09 NOTE — Progress Notes (Signed)
Subjective:     Patient ID: Whitney Cook, female   DOB: Feb 08, 1966, 48 y.o.   MRN: [id number]  HPI patient states I'm doing really well with my left foot and have mild swelling and achiness if I been on it too long   Review of Systems     Objective:   Physical Exam Neurovascular status intact negative Homans sign was noted and patient's found to have a well-healing surgical site first metatarsal left with good alignment noted and wound edges well coapted with good range of motion first MPJ    Assessment:     Healing well from both post Austin osteotomy left with pin fixation    Plan:     Reviewed condition and patient was given a new dressing surgical shoe and instructed on continued immobilization along with movement of the first MPJ reappoint for us to recheck again in 3 weeks earlier if any issues should occur

## 2014-03-02 ENCOUNTER — Ambulatory Visit (INDEPENDENT_AMBULATORY_CARE_PROVIDER_SITE_OTHER): Payer: BC Managed Care – PPO

## 2014-03-02 ENCOUNTER — Ambulatory Visit: Payer: BC Managed Care – PPO | Admitting: Podiatry

## 2014-03-02 ENCOUNTER — Encounter: Payer: Self-pay | Admitting: Podiatry

## 2014-03-02 ENCOUNTER — Ambulatory Visit (INDEPENDENT_AMBULATORY_CARE_PROVIDER_SITE_OTHER): Payer: BC Managed Care – PPO | Admitting: Podiatry

## 2014-03-02 VITALS — BP 120/80 | HR 77 | Resp 18

## 2014-03-02 DIAGNOSIS — M2012 Hallux valgus (acquired), left foot: Secondary | ICD-10-CM

## 2014-03-03 NOTE — Progress Notes (Signed)
Subjective:     Patient ID: Whitney Cook, female   DOB: 06/05/1965, 48 y.o.   MRN: [id number]  HPI patient states I'm doing really well with my left foot with the pain reduced minimal swelling and able to walk distances at this time neurovascular status intact with well structured first metatarsal left with wound edges well coapted and good alignment with range of motion adequate 30 dorsiflexion 30 plantarflexion   Review of Systems     Objective:   Physical Exam     Assessment:     Doing well post surgery on the left foot    Plan:     X-rays reviewed with patient and advised on importance of range of motion exercises and reappoint again in 4 weeks earlier if any issues should occur

## 2014-05-04 ENCOUNTER — Encounter: Payer: Self-pay | Admitting: Podiatry

## 2014-05-11 ENCOUNTER — Ambulatory Visit (INDEPENDENT_AMBULATORY_CARE_PROVIDER_SITE_OTHER): Payer: Self-pay | Admitting: Podiatry

## 2014-05-11 ENCOUNTER — Ambulatory Visit: Payer: Self-pay

## 2014-05-11 VITALS — BP 144/90 | HR 85 | Resp 16

## 2014-05-11 DIAGNOSIS — Z9889 Other specified postprocedural states: Secondary | ICD-10-CM

## 2014-05-12 NOTE — Progress Notes (Signed)
Subjective:     Patient ID: Whitney Cook, female   DOB: [birth year], 49 y.o.   MRN: [id number]  HPI patient states she's doing real well with her left foot with minimal discomfort good range of motion and able to do most activities without pain   Review of Systems     Objective:   Physical Exam Neurovascular status intact with wound edges well coapted and first metatarsal necks and alignment with good dorsi and plantarflexion    Assessment:     Doing well post surgery left first metatarsal    Plan:     X-rays reviewed and advised on continued exercises for this big toe joint. Reappoint for us to recheck again in the next 8 weeks or if any symptoms should occur and resume normal activities

## 2015-04-12 ENCOUNTER — Encounter: Payer: Self-pay | Admitting: Podiatry

## 2015-04-12 ENCOUNTER — Ambulatory Visit (INDEPENDENT_AMBULATORY_CARE_PROVIDER_SITE_OTHER): Payer: BLUE CROSS/BLUE SHIELD | Admitting: Podiatry

## 2015-04-12 VITALS — BP 134/98 | HR 84 | Resp 16

## 2015-04-12 DIAGNOSIS — B351 Tinea unguium: Secondary | ICD-10-CM | POA: Diagnosis not present

## 2015-04-12 DIAGNOSIS — M79673 Pain in unspecified foot: Secondary | ICD-10-CM

## 2015-04-12 MED ORDER — TERBINAFINE HCL 250 MG PO TABS: ORAL_TABLET | ORAL | Status: DC

## 2015-04-13 NOTE — Progress Notes (Signed)
Subjective:     Patient ID: Whitney Cook, female   DOB: 07-22-1965, 50 y.o.   MRN: [id number]  HPI patient presents stating I'm getting discoloration of my big toenails of both feet with yellow like appearance   Review of Systems     Objective:   Physical Exam  neurovascular status intact with mycotic nail infection hallux bilateral    Assessment:      mycosis of the hallux bilateral    Plan:      reviewed condition and recommended short-term laser treatment and topical medicine and she is scheduled for 3 laser treatments to be time to the big toenails

## 2015-04-19 ENCOUNTER — Ambulatory Visit (INDEPENDENT_AMBULATORY_CARE_PROVIDER_SITE_OTHER): Payer: BLUE CROSS/BLUE SHIELD | Admitting: Podiatry

## 2015-04-19 DIAGNOSIS — B351 Tinea unguium: Secondary | ICD-10-CM

## 2015-05-04 ENCOUNTER — Encounter: Payer: Self-pay | Admitting: Cardiology

## 2015-05-04 ENCOUNTER — Telehealth: Payer: Self-pay | Admitting: Cardiology

## 2015-05-04 NOTE — Telephone Encounter (Signed)
Called pt and left message asking pt to give our office a call back to get information to update pt's medical and Fm Hx.

## 2015-05-11 ENCOUNTER — Ambulatory Visit: Payer: Self-pay | Admitting: Cardiology

## 2015-05-17 ENCOUNTER — Other Ambulatory Visit: Payer: BLUE CROSS/BLUE SHIELD

## 2015-06-14 ENCOUNTER — Ambulatory Visit (INDEPENDENT_AMBULATORY_CARE_PROVIDER_SITE_OTHER): Payer: BLUE CROSS/BLUE SHIELD

## 2015-06-14 DIAGNOSIS — B351 Tinea unguium: Secondary | ICD-10-CM

## 2015-06-14 NOTE — Progress Notes (Signed)
Subjective:     Patient ID: Whitney Cook, female   DOB: 02/10/1966, 50 y.o.   MRN: [id number]  HPI patient presents stating I'm getting discoloration of my big toenails of both feet with yellow like appearance   Review of Systems     Objective:   Physical Exam  neurovascular status intact with mycotic nail infection hallux bilateral    Assessment:      mycosis of the hallux bilateral    Plan:      reviewed condition and recommended short-term laser treatment and topical medicine and she is scheduled for 3 laser treatments to be time to the big toenails

## 2015-06-22 DIAGNOSIS — R079 Chest pain, unspecified: Secondary | ICD-10-CM | POA: Insufficient documentation

## 2015-06-22 DIAGNOSIS — Z681 Body mass index (BMI) 19 or less, adult: Secondary | ICD-10-CM | POA: Insufficient documentation

## 2015-06-23 ENCOUNTER — Ambulatory Visit: Payer: Self-pay | Admitting: Cardiology

## 2015-08-30 ENCOUNTER — Other Ambulatory Visit: Payer: Self-pay | Admitting: Obstetrics and Gynecology

## 2015-08-30 DIAGNOSIS — R928 Other abnormal and inconclusive findings on diagnostic imaging of breast: Secondary | ICD-10-CM

## 2015-09-03 ENCOUNTER — Other Ambulatory Visit: Payer: Self-pay

## 2015-09-06 ENCOUNTER — Ambulatory Visit
Admission: RE | Admit: 2015-09-06 | Discharge: 2015-09-06 | Disposition: A | Discharge status: Home / Self Care | Payer: BLUE CROSS/BLUE SHIELD | Source: Ambulatory Visit | Attending: Obstetrics and Gynecology | Admitting: Obstetrics and Gynecology

## 2015-09-06 DIAGNOSIS — R928 Other abnormal and inconclusive findings on diagnostic imaging of breast: Secondary | ICD-10-CM

## 2015-10-29 ENCOUNTER — Encounter: Payer: Self-pay | Admitting: Internal Medicine

## 2015-11-08 ENCOUNTER — Ambulatory Visit: Payer: BLUE CROSS/BLUE SHIELD | Admitting: Podiatry

## 2015-11-11 ENCOUNTER — Ambulatory Visit: Payer: BLUE CROSS/BLUE SHIELD | Admitting: Podiatry

## 2015-11-15 ENCOUNTER — Ambulatory Visit (INDEPENDENT_AMBULATORY_CARE_PROVIDER_SITE_OTHER): Payer: BLUE CROSS/BLUE SHIELD | Admitting: Podiatry

## 2015-11-15 DIAGNOSIS — B351 Tinea unguium: Secondary | ICD-10-CM | POA: Diagnosis not present

## 2015-11-16 NOTE — Progress Notes (Signed)
Subjective:     Patient ID: Whitney Cook, female   DOB: 04/18/1965, 50 y.o.   MRN: [id number]  HPI patient presents with slight changes in the left hallux nail but very happy from previous laser   Review of Systems     Objective:   Physical Exam Neurovascular status intact with slight changes in the left over right hallux distally but much better than preoperatively    Assessment:     Doing better with mycotic nail treatment    Plan:     Advised on the continuation of topicals and we will try to hold off on oral laser and less necessary

## 2015-12-29 ENCOUNTER — Encounter: Payer: Self-pay | Admitting: Internal Medicine

## 2016-11-23 ENCOUNTER — Other Ambulatory Visit: Payer: Self-pay | Admitting: Obstetrics and Gynecology

## 2016-11-23 DIAGNOSIS — Z1231 Encounter for screening mammogram for malignant neoplasm of breast: Secondary | ICD-10-CM

## 2016-11-30 ENCOUNTER — Ambulatory Visit: Payer: BLUE CROSS/BLUE SHIELD

## 2016-12-05 ENCOUNTER — Ambulatory Visit
Admission: RE | Admit: 2016-12-05 | Discharge: 2016-12-05 | Disposition: A | Discharge status: Home / Self Care | Payer: Commercial Managed Care - PPO | Source: Ambulatory Visit | Attending: Obstetrics and Gynecology | Admitting: Obstetrics and Gynecology

## 2016-12-05 DIAGNOSIS — Z1231 Encounter for screening mammogram for malignant neoplasm of breast: Secondary | ICD-10-CM

## 2017-11-23 ENCOUNTER — Other Ambulatory Visit: Payer: Self-pay | Admitting: Obstetrics and Gynecology

## 2017-11-23 DIAGNOSIS — Z1231 Encounter for screening mammogram for malignant neoplasm of breast: Secondary | ICD-10-CM

## 2018-01-01 ENCOUNTER — Ambulatory Visit
Admission: RE | Admit: 2018-01-01 | Discharge: 2018-01-01 | Disposition: A | Discharge status: Home / Self Care | Payer: Commercial Managed Care - PPO | Source: Ambulatory Visit | Attending: Obstetrics and Gynecology | Admitting: Obstetrics and Gynecology

## 2018-01-01 DIAGNOSIS — Z1231 Encounter for screening mammogram for malignant neoplasm of breast: Secondary | ICD-10-CM

## 2018-10-09 ENCOUNTER — Encounter (HOSPITAL_COMMUNITY): Payer: Self-pay

## 2018-10-09 ENCOUNTER — Ambulatory Visit (HOSPITAL_COMMUNITY): Admission: RE | Admit: 2018-10-09 | Payer: Commercial Managed Care - PPO | Source: Ambulatory Visit

## 2018-10-09 ENCOUNTER — Other Ambulatory Visit: Payer: Self-pay

## 2018-10-09 ENCOUNTER — Other Ambulatory Visit: Payer: Self-pay | Admitting: Internal Medicine

## 2018-10-09 DIAGNOSIS — M549 Dorsalgia, unspecified: Secondary | ICD-10-CM

## 2019-03-18 ENCOUNTER — Other Ambulatory Visit: Payer: Self-pay | Admitting: Obstetrics and Gynecology

## 2019-03-18 DIAGNOSIS — Z1231 Encounter for screening mammogram for malignant neoplasm of breast: Secondary | ICD-10-CM

## 2019-04-28 ENCOUNTER — Ambulatory Visit
Admission: RE | Admit: 2019-04-28 | Discharge: 2019-04-28 | Disposition: A | Discharge status: Home / Self Care | Payer: Commercial Managed Care - PPO | Source: Ambulatory Visit | Attending: Obstetrics and Gynecology | Admitting: Obstetrics and Gynecology

## 2019-04-28 ENCOUNTER — Other Ambulatory Visit: Payer: Self-pay

## 2019-04-28 DIAGNOSIS — Z1231 Encounter for screening mammogram for malignant neoplasm of breast: Secondary | ICD-10-CM

## 2020-04-13 ENCOUNTER — Other Ambulatory Visit: Payer: Self-pay | Admitting: Obstetrics and Gynecology

## 2020-04-13 DIAGNOSIS — Z1231 Encounter for screening mammogram for malignant neoplasm of breast: Secondary | ICD-10-CM

## 2020-05-28 ENCOUNTER — Ambulatory Visit
Admission: RE | Admit: 2020-05-28 | Discharge: 2020-05-28 | Disposition: A | Discharge status: Home / Self Care | Payer: Commercial Managed Care - PPO | Source: Ambulatory Visit | Attending: Obstetrics and Gynecology | Admitting: Obstetrics and Gynecology

## 2020-05-28 ENCOUNTER — Other Ambulatory Visit: Payer: Self-pay

## 2020-05-28 DIAGNOSIS — Z1231 Encounter for screening mammogram for malignant neoplasm of breast: Secondary | ICD-10-CM

## 2020-06-01 ENCOUNTER — Other Ambulatory Visit: Payer: Self-pay | Admitting: Obstetrics and Gynecology

## 2020-06-01 DIAGNOSIS — R928 Other abnormal and inconclusive findings on diagnostic imaging of breast: Secondary | ICD-10-CM

## 2020-06-10 ENCOUNTER — Ambulatory Visit
Admission: RE | Admit: 2020-06-10 | Discharge: 2020-06-10 | Disposition: A | Discharge status: Home / Self Care | Payer: Commercial Managed Care - PPO | Source: Ambulatory Visit | Attending: Obstetrics and Gynecology | Admitting: Obstetrics and Gynecology

## 2020-06-10 ENCOUNTER — Other Ambulatory Visit: Payer: Self-pay

## 2020-06-10 DIAGNOSIS — R928 Other abnormal and inconclusive findings on diagnostic imaging of breast: Secondary | ICD-10-CM

## 2020-09-06 ENCOUNTER — Other Ambulatory Visit: Payer: Self-pay

## 2020-09-06 ENCOUNTER — Emergency Department (HOSPITAL_COMMUNITY): Payer: BC Managed Care – PPO

## 2020-09-06 ENCOUNTER — Other Ambulatory Visit: Payer: Self-pay | Admitting: Physician Assistant

## 2020-09-06 ENCOUNTER — Other Ambulatory Visit (HOSPITAL_COMMUNITY): Payer: Self-pay | Admitting: Physician Assistant

## 2020-09-06 ENCOUNTER — Emergency Department (HOSPITAL_COMMUNITY)
Admission: EM | Admit: 2020-09-06 | Discharge: 2020-09-07 | Disposition: A | Discharge status: Home / Self Care | Payer: BC Managed Care – PPO | Attending: Emergency Medicine | Admitting: Emergency Medicine

## 2020-09-06 DIAGNOSIS — R0602 Shortness of breath: Secondary | ICD-10-CM | POA: Diagnosis not present

## 2020-09-06 DIAGNOSIS — R531 Weakness: Secondary | ICD-10-CM | POA: Insufficient documentation

## 2020-09-06 DIAGNOSIS — R079 Chest pain, unspecified: Secondary | ICD-10-CM

## 2020-09-06 DIAGNOSIS — R072 Precordial pain: Secondary | ICD-10-CM | POA: Insufficient documentation

## 2020-09-06 DIAGNOSIS — M79604 Pain in right leg: Secondary | ICD-10-CM

## 2020-09-06 LAB — CBC
HCT: 38.1 % (ref 36.0–46.0)
Hemoglobin: 12.8 g/dL (ref 12.0–15.0)
MCH: 29.8 pg (ref 26.0–34.0)
MCHC: 33.6 g/dL (ref 30.0–36.0)
MCV: 88.6 fL (ref 80.0–100.0)
Platelets: 231 10*3/uL (ref 150–400)
RBC: 4.3 MIL/uL (ref 3.87–5.11)
RDW: 12.8 % (ref 11.5–15.5)
WBC: 7.1 10*3/uL (ref 4.0–10.5)
nRBC: 0 % (ref 0.0–0.2)

## 2020-09-06 LAB — D-DIMER, QUANTITATIVE: D-Dimer, Quant: 0.47 ug/mL-FEU (ref 0.00–0.50)

## 2020-09-06 NOTE — ED Triage Notes (Signed)
Patient arrives POV to ED for CP that started around 7pm tonight. Pt states heaviness on central chest that radiates to Lf shoulder blade. Patient also states having Knee surgery last week and was instructed by PCP to come to ED to rule out DVT. Hx of HTN.

## 2020-09-06 NOTE — ED Provider Notes (Signed)
Emergency Medicine Provider Triage Evaluation Note  Whitney Cook , a 55 y.o. female  was evaluated in triage.  Pt complains of chest tightness that started tonight at 7pm.  Reports mild associated SOB.  Denies fever, chills, or cough.  Recently had knee surgery.  Had some pain behind her knee and has an [place] ordered for DVT r/o tomorrow.  Denies heart or lung problems.  Review of Systems  Positive: Chest tightness, SOB Negative: Fever, cough  Physical Exam  BP (!) 149/91 (BP Location: Right Arm)   Pulse 70   Temp 98.4 F (36.9 C) (Oral)   Resp 18   SpO2 97%  Gen:   Awake, no distress   Resp:  Normal effort  MSK:   Moves extremities without difficulty  Other:    Medical Decision Making  Medically screening exam initiated at 10:06 PM.  Appropriate orders placed.  Justeen Hehr was informed that the remainder of the evaluation will be completed by another provider, this initial triage assessment does not replace that evaluation, and the importance of remaining in the ED until their evaluation is complete.  Can't PERC.  Recent surgery.  Has [place] ordered for tomorrow by outside facility.  Not hypoxic nor tachycardic, but is at increased risk of blood clot.  D-dimer ordered.   Roxy Horseman, PA-C 09/06/20 2208    Blane Ohara, MD 09/06/20 [phone]

## 2020-09-07 ENCOUNTER — Emergency Department (HOSPITAL_BASED_OUTPATIENT_CLINIC_OR_DEPARTMENT_OTHER): Payer: BC Managed Care – PPO

## 2020-09-07 ENCOUNTER — Ambulatory Visit (HOSPITAL_COMMUNITY): Admission: RE | Admit: 2020-09-07 | Payer: BC Managed Care – PPO | Source: Ambulatory Visit

## 2020-09-07 ENCOUNTER — Emergency Department (HOSPITAL_COMMUNITY): Payer: BC Managed Care – PPO

## 2020-09-07 ENCOUNTER — Encounter (HOSPITAL_COMMUNITY): Payer: Self-pay

## 2020-09-07 DIAGNOSIS — M7989 Other specified soft tissue disorders: Secondary | ICD-10-CM | POA: Diagnosis not present

## 2020-09-07 DIAGNOSIS — M25561 Pain in right knee: Secondary | ICD-10-CM

## 2020-09-07 LAB — BASIC METABOLIC PANEL
Anion gap: 8 (ref 5–15)
BUN: 23 mg/dL — ABNORMAL HIGH (ref 6–20)
CO2: 25 mmol/L (ref 22–32)
Calcium: 9.3 mg/dL (ref 8.9–10.3)
Chloride: 106 mmol/L (ref 98–111)
Creatinine, Ser: 0.79 mg/dL (ref 0.44–1.00)
GFR, Estimated: >60 mL/min (ref >60)
Glucose, Bld: 103 mg/dL — ABNORMAL HIGH (ref 70–99)
Potassium: 3.8 mmol/L (ref 3.5–5.1)
Sodium: 139 mmol/L (ref 135–145)

## 2020-09-07 LAB — TROPONIN I (HIGH SENSITIVITY)
Troponin I (High Sensitivity): 8 ng/L (ref <18)
Troponin I (High Sensitivity): 5 ng/L (ref <18)

## 2020-09-07 LAB — I-STAT BETA HCG BLOOD, ED (MC, WL, AP ONLY): I-stat hCG, quantitative: <5 m[IU]/mL (ref <5)

## 2020-09-07 MED ORDER — SODIUM CHLORIDE 0.9 % IV BOLUS
1000.0000 mL | Freq: Once | INTRAVENOUS | Status: AC
  Administered 2020-09-07: 1000 mL via INTRAVENOUS

## 2020-09-07 MED ORDER — IOHEXOL 350 MG/ML SOLN
75.0000 mL | Freq: Once | INTRAVENOUS | Status: AC | PRN
  Administered 2020-09-07: 75 mL via INTRAVENOUS

## 2020-09-07 NOTE — Discharge Instructions (Addendum)
Your laboratory results are within normal limits today.  The ultrasound of your right leg did not show any blood clot.  Continue to follow-up with your primary care physician for further monitoring of your blood pressure.

## 2020-09-07 NOTE — ED Notes (Signed)
Patient transported to CT 

## 2020-09-07 NOTE — ED Provider Notes (Signed)
[facility]   CSN: [id number] Arrival date & time: 09/06/20  2110     History Chief Complaint  Patient presents with   Chest Pain    Whitney Mortngela Carreira is a 55 y.o. female.  55 y.o female with a PMH of HTN presents to the ED with a chief complaint of chest pressure along the substernal region for the past 4 days.  Also endorses feeling a sense of heaviness in the center of her chest with radiation to in between her shoulder blades.  Also endorses significant elevated blood pressure at home during the past 4 days, with a systolic number in the 200s, diastolic in the upper 100s, reports her blood pressure is usually well controlled with losartan.  She did contact her PCP who also recommended clonidine, has taken 1 on Friday, 1 on Sunday with some improvement in blood pressure.  Does report feeling overall " not great on clonidine ".  She was also sent for a right knee ultrasound to rule out DVT, this is scheduled today at noon.  Recent right knee scope about a week ago. She denies any prior hx of CAD, blood clots or prior tobacco usage.     The history is provided by the patient.  Chest Pain Pain location:  Substernal area Pain quality: pressure   Pain radiates to:  Does not radiate Pain severity:  Moderate Onset quality:  Gradual Duration:  4 days Timing:  Intermittent Progression:  Worsening Chronicity:  New Context: breathing and movement   Relieved by:  Nothing Worsened by:  Nothing Associated symptoms: shortness of breath and weakness   Associated symptoms: no abdominal pain, no anxiety, no cough, no dizziness, no fever, no nausea, no numbness and no vomiting   Risk factors: hypertension and surgery   Risk factors: no coronary artery disease, no diabetes mellitus, no high cholesterol, no prior DVT/PE and no smoking       Past Medical History:  Diagnosis Date   Adult BMI <19 kg/sq m    Chest pain     Patient Active Problem  List   Diagnosis Date Noted   Chest pain    Adult BMI <19 kg/sq m     Past Surgical History:  Procedure Laterality Date   FOOT SURGERY       OB History   No obstetric history on file.     Family History  Problem Relation Age of Onset   Breast cancer Neg Hx     Social History   Tobacco Use   Smoking status: Never  Substance Use Topics   Alcohol use: No   Drug use: No    Home Medications Prior to Admission medications   Medication Sig Start Date End Date Taking? Authorizing Provider  co-enzyme Q-10 30 MG capsule Take 30 mg by mouth 3 (three) times daily.    [provider]  levonorgestrel-ethinyl estradiol (AVIANE,ALESSE,LESSINA) 0.1-20 MG-MCG tablet Take 1 tablet by mouth daily.    [provider]  Multiple Vitamin (MULTIVITAMIN WITH MINERALS) TABS Take 1 tablet by mouth at bedtime.    [provider]    Allergies    Flagyl [metronidazole]  Review of Systems   Review of Systems  Constitutional:  Negative for fever.  Respiratory:  Positive for shortness of breath. Negative for cough.   Cardiovascular:  Positive for chest pain.  Gastrointestinal:  Negative for abdominal pain, nausea and vomiting.  Neurological:  Positive for weakness. Negative for dizziness, seizures,  light-headedness and numbness.  All other systems reviewed and are negative.  Physical Exam Updated Vital Signs BP 136/87   Pulse 68   Temp 98 F (36.7 C) (Oral)   Resp 14   Ht 5\' 6"  (1.676 m)   Wt 79.4 kg   SpO2 96%   BMI 28.25 kg/m   Physical Exam Vitals and nursing note reviewed.  Constitutional:      Appearance: She is well-developed. She is not ill-appearing or toxic-appearing.  HENT:     Head: Normocephalic and atraumatic.  Cardiovascular:     Rate and Rhythm: Normal rate.  Pulmonary:     Breath sounds: Normal breath sounds. No decreased breath sounds.  Musculoskeletal:     Cervical back: Normal range of motion and neck supple.  Neurological:      Mental Status: She is alert.    ED Results / Procedures / Treatments   Labs (all labs ordered are listed, but only abnormal results are displayed) Labs Reviewed  BASIC METABOLIC PANEL - Abnormal; Notable for the following components:      Result Value   Glucose, Bld 103 (*)    BUN 23 (*)    All other components within normal limits  CBC  D-DIMER, QUANTITATIVE  I-STAT BETA HCG BLOOD, ED ([facility], [facility], [facility] ONLY)  TROPONIN I (HIGH SENSITIVITY)  TROPONIN I (HIGH SENSITIVITY)    EKG EKG Interpretation  Date/Time:  Monday September 06 2020 22:07:12 EDT Ventricular Rate:  81 PR Interval:  144 QRS Duration: 78 QT Interval:  388 QTC Calculation: 450 R Axis:   14 Text Interpretation: Normal sinus rhythm Nonspecific ST abnormality Abnormal ECG When compared with ECG of 08/03/2012, No significant change was found Confirmed by 08/05/2012 (Dione Booze) on 09/07/2020 3:27:45 AM  Radiology DG Chest 2 View  Result Date: 09/06/2020 CLINICAL DATA:  55 year old female with chest pain. EXAM: CHEST - 2 VIEW COMPARISON:  Chest radiograph dated 08/03/2012 FINDINGS: The heart size and mediastinal contours are within normal limits. Both lungs are clear. The visualized skeletal structures are unremarkable. IMPRESSION: No active cardiopulmonary disease. Electronically Signed   By: 08/05/2012 M.D.   On: 09/06/2020 22:49   CT ANGIO CHEST AORTA W/CM & OR WO/CM  Result Date: 09/07/2020 CLINICAL DATA:  55 year old female with history of chest pain and back pain. Suspected aortic dissection. EXAM: CT ANGIOGRAPHY CHEST WITH CONTRAST TECHNIQUE: Multidetector CT imaging of the chest was performed using the standard protocol during bolus administration of intravenous contrast. Multiplanar CT image reconstructions and MIPs were obtained to evaluate the vascular anatomy. CONTRAST:  31mL OMNIPAQUE IOHEXOL 350 MG/ML SOLN COMPARISON:  No priors. FINDINGS: Cardiovascular: No evidence of thoracic aortic aneurysm or dissection.  Ascending aorta, mid aortic arch and descending thoracic aorta measure 3.4 cm, 2.5 cm and 2.2 cm in diameter respectively. Heart size is normal. There is no significant pericardial fluid, thickening or pericardial calcification. There is aortic atherosclerosis, as well as the coronary arteries, including calcified atherosclerotic plaque in the left main and left anterior descending coronary arteries. Mediastinum/Nodes: No pathologically enlarged mediastinal or hilar lymph nodes. Small hiatal hernia. No axillary lymphadenopathy. Lungs/Pleura: No acute consolidative airspace disease. No pleural effusions. No suspicious appearing pulmonary nodules or masses are noted. Upper Abdomen: Unremarkable. Musculoskeletal: There are no aggressive appearing lytic or blastic lesions noted in the visualized portions of the skeleton. Review of the MIP images confirms the above findings. IMPRESSION: 1. No acute findings in the thorax to account for the patient's symptoms. Specifically, no evidence  of thoracic aortic aneurysm or dissection. 2. Aortic atherosclerosis, in addition to left main and left anterior descending coronary artery disease. Please note that although the presence of coronary artery calcium documents the presence of coronary artery disease, the severity of this disease and any potential stenosis cannot be assessed on this non-gated CT examination. Assessment for potential risk factor modification, dietary therapy or pharmacologic therapy may be warranted, if clinically indicated. Aortic Atherosclerosis (ICD10-I70.0). Electronically Signed   By: Trudie Reed M.D.   On: 09/07/2020 09:24   VAS [place] LOWER EXTREMITY VENOUS (DVT) ([facility] and [facility] 7a-7p)  Result Date: 09/07/2020  Lower Venous DVT Study Patient Name:  ARDELIA WREDE  Date of Exam:   09/07/2020 Medical Rec #: [id number]    Accession #:    [id number] Date of Birth: 1966/03/03    Patient Gender: F Patient Age:   47Y Exam Location:  [facility] Procedure:       VAS [place] LOWER EXTREMITY VENOUS (DVT) Referring Phys: [id number] Leonie Douglas Vail Basista --------------------------------------------------------------------------------  Indications: Pain and swelling behind RT knee.  Comparison Study: No recent prior studies. Performing Technologist: Jean Rosenthal RDMS,RVT  Examination Guidelines: A complete evaluation includes B-mode imaging, spectral Doppler, color Doppler, and power Doppler as needed of all accessible portions of each vessel. Bilateral testing is considered an integral part of a complete examination. Limited examinations for reoccurring indications may be performed as noted. The reflux portion of the exam is performed with the patient in reverse Trendelenburg.  +---------+---------------+---------+-----------+----------+--------------+ RIGHT    CompressibilityPhasicitySpontaneityPropertiesThrombus Aging +---------+---------------+---------+-----------+----------+--------------+ CFV      Full           Yes      Yes                                 +---------+---------------+---------+-----------+----------+--------------+ SFJ      Full                                                        +---------+---------------+---------+-----------+----------+--------------+ FV Prox  Full                                                        +---------+---------------+---------+-----------+----------+--------------+ FV Mid   Full                                                        +---------+---------------+---------+-----------+----------+--------------+ FV DistalFull                                                        +---------+---------------+---------+-----------+----------+--------------+ PFV      Full                                                        +---------+---------------+---------+-----------+----------+--------------+  POP      Full           Yes      Yes                                  +---------+---------------+---------+-----------+----------+--------------+ PTV      Full                                                        +---------+---------------+---------+-----------+----------+--------------+ PERO     Full                                                        +---------+---------------+---------+-----------+----------+--------------+ Gastroc  Full                                                        +---------+---------------+---------+-----------+----------+--------------+   +----+---------------+---------+-----------+----------+--------------+ LEFTCompressibilityPhasicitySpontaneityPropertiesThrombus Aging +----+---------------+---------+-----------+----------+--------------+ CFV Full           Yes      Yes                                 +----+---------------+---------+-----------+----------+--------------+     Summary: RIGHT: - There is no evidence of deep vein thrombosis in the lower extremity.  - A cystic structure is found in the popliteal fossa.  LEFT: - No evidence of common femoral vein obstruction.  *See table(s) above for measurements and observations.    Preliminary     Procedures Procedures   Medications Ordered in ED Medications  sodium chloride 0.9 % bolus 1,000 mL (1,000 mLs Intravenous New Bag/Given 09/07/20 0818)  iohexol (OMNIPAQUE) 350 MG/ML injection 75 mL (75 mLs Intravenous Contrast Given 09/07/20 0855)    ED Course  I have reviewed the triage vital signs and the nursing notes.  Pertinent labs & imaging results that were available during my care of the patient were reviewed by me and considered in my medical decision making (see chart for details).    MDM Rules/Calculators/A&P  Patient presents to the ED with 4 days of chest pressure with radiation to her back, along with elevated blood pressures at home with systolic numbers in the 200s.  Blood pressure currently well controlled on losartan, called PCP and  obtain additional medication such as clonidine which she has taken for the past 2 days with provement in her blood pressure.  States recently having a right knee scope, has had some right leg pain along with shortness of breath that has been off and on for the past 4 days.  No prior history of CAD, no history of tobacco use, no prior history of blood clot.  Currently not on any blood thinners.Differential diagnosis included but not limited to ACS, PE, Dissection, atypical chest wall pain.   Pain is described as heaviness to the chest wall with radiation between her shoulder blades. Shortness of breath when episodes occur.  No fevers, no cough, no numbness or weakness.   Interpretation of her labs by me after evaluating patient 10 hours since arrival to the ED.  CBC without any leukocytosis, no signs of anemia.  BMP without any electrolyte abnormality, creatinine levels within normal limits.  Troponin x2 is negative.  hCG is negative.  D-dimer level negative as well.  HEART SCORE 2  Chest x-ray without any pneumonia, pleural effusion, pneumothorax.  We did discuss obtaining right knee ultrasound while patient has been in the ER.  During her ED visit, blood pressure has been in normal limits, with the highest number around 150 systolic, 90s diastolic.  She is with no signs of hypoxia.  No signs of distress.  On exam she is alert, lungs are clear to auscultation, pain is nonreproducible with palpation of the chest wall.  Abdomen is soft nontender to palpation.  There is no bilateral leg swelling or pitting edema.  Ultrasound of her right leg was negative for any DVT.  CT Angio to rule out dissection: 1. No acute findings in the thorax to account for the patient's  symptoms. Specifically, no evidence of thoracic aortic aneurysm or  dissection.   These results were discussed at length with patient.  We did encourage follow-up with PCP.  She will need to monitor her blood pressure at home.  Patient  understands and agrees with management, return precautions discussed at length.  Patient stable for discharge.  Portions of this note were generated with Scientist, clinical (histocompatibility and immunogenetics). Dictation errors may occur despite best attempts at proofreading.  Final Clinical Impression(s) / ED Diagnoses Final diagnoses:  Chest pain, unspecified type    Rx / DC Orders ED Discharge Orders     None        Claude Manges, PA-C 09/07/20 [id number]    Wynetta Fines, MD 09/08/20 1459

## 2020-09-07 NOTE — Progress Notes (Signed)
Lower extremity venous RT study completed.  Preliminary results relayed to provider in ED.   See CV Proc for preliminary results report.   Deryn Massengale, RDMS, RVT  

## 2021-05-26 ENCOUNTER — Ambulatory Visit
Admission: RE | Admit: 2021-05-26 | Discharge: 2021-05-26 | Disposition: A | Discharge status: Home / Self Care | Payer: BC Managed Care – PPO | Source: Ambulatory Visit

## 2021-05-26 ENCOUNTER — Other Ambulatory Visit: Payer: Self-pay | Admitting: Obstetrics and Gynecology

## 2021-05-26 ENCOUNTER — Other Ambulatory Visit: Payer: Self-pay

## 2021-05-26 VITALS — BP 137/88 | HR 90 | Temp 99.1°F | Resp 16

## 2021-05-26 DIAGNOSIS — H109 Unspecified conjunctivitis: Secondary | ICD-10-CM | POA: Diagnosis not present

## 2021-05-26 DIAGNOSIS — B9689 Other specified bacterial agents as the cause of diseases classified elsewhere: Secondary | ICD-10-CM

## 2021-05-26 DIAGNOSIS — J069 Acute upper respiratory infection, unspecified: Secondary | ICD-10-CM | POA: Diagnosis not present

## 2021-05-26 DIAGNOSIS — Z1231 Encounter for screening mammogram for malignant neoplasm of breast: Secondary | ICD-10-CM

## 2021-05-26 MED ORDER — TOBRAMYCIN 0.3 % OP SOLN: 1.0000 [drp] | OPHTHALMIC | 0 refills | Status: AC

## 2021-05-26 MED ORDER — PSEUDOEPHEDRINE HCL 60 MG PO TABS: 60.0000 mg | ORAL_TABLET | Freq: Three times a day (TID) | ORAL | 0 refills | Status: AC | PRN

## 2021-05-26 MED ORDER — CETIRIZINE HCL 10 MG PO TABS: 10.0000 mg | ORAL_TABLET | Freq: Every day | ORAL | 0 refills | Status: AC

## 2021-05-26 NOTE — ED Provider Notes (Signed)
?[facility] ? ? ?MRN: [id number] DOB: 03/05/1966 ? ?Subjective:  ? ?Whitney Cook is a 56 y.o. female presenting for 2-day history of acute onset bilateral eye redness worse over the right eye but now on the left side.  The eyes have been watering and irritated all day today.  Has also had runny and stuffy nose, postnasal drainage.  Has had very close contact to multiple family members that have had pinkeye.  No vision changes, eyelid pain or swelling.  No fevers, photophobia.  No chest pain, shortness of breath or wheezing. ? ?No current facility-administered medications for this encounter. ? ?Current Outpatient Medications:  ?  co-enzyme Q-10 30 MG capsule, Take 30 mg by mouth 3 (three) times daily., Disp: , Rfl:  ?  estradiol (VIVELLE-DOT) 0.075 MG/24HR, estradiol 0.075 mg/24 hr semiweekly transdermal patch  Apply 1 patch twice a week by transdermal route., Disp: , Rfl:  ?  estradiol-norethindrone (COMBIPATCH) 0.05-0.14 MG/DAY, CombiPatch 0.05 mg-0.14 mg/24 hr transdermal, Disp: , Rfl:  ?  levonorgestrel-ethinyl estradiol (AVIANE,ALESSE,LESSINA) 0.1-20 MG-MCG tablet, Take 1 tablet by mouth daily., Disp: , Rfl:  ?  losartan (COZAAR) 50 MG tablet, Take 50 mg by mouth 2 (two) times daily., Disp: , Rfl:  ?  Multiple Vitamin (MULTIVITAMIN WITH MINERALS) TABS, Take 1 tablet by mouth at bedtime., Disp: , Rfl:   ? ?Allergies  ?Allergen Reactions  ? Metronidazole Nausea And Vomiting and Other (See Comments)  ?  Also caused dizziness ?Other reaction(s): Dizziness, Dizziness (intolerance), Vomiting  ? ? ?Past Medical History:  ?Diagnosis Date  ? Adult BMI <19 kg/sq m   ? Chest pain   ?  ? ?Past Surgical History:  ?Procedure Laterality Date  ? FOOT SURGERY    ? ? ?Family History  ?Problem Relation Age of Onset  ? Breast cancer Neg Hx   ? ? ?Social History  ? ?Tobacco Use  ? Smoking status: Never  ?Substance Use Topics  ? Alcohol use: No  ? Drug use: No  ? ? ?ROS ? ? ?Objective:  ? ?Vitals: ?BP 137/88 (BP  Location: Right Arm)   Pulse 90   Temp 99.1 ?F (37.3 ?C) (Oral)   Resp 16   SpO2 95%  ? ?Physical Exam ?Constitutional:   ?   General: She is not in acute distress. ?   Appearance: Normal appearance. She is well-developed and normal weight. She is not ill-appearing, toxic-appearing or diaphoretic.  ?HENT:  ?   Head: Normocephalic and atraumatic.  ?   Right Ear: Tympanic membrane, ear canal and external ear normal. No drainage or tenderness. No middle ear effusion. There is no impacted cerumen. Tympanic membrane is not erythematous.  ?   Left Ear: Tympanic membrane, ear canal and external ear normal. No drainage or tenderness.  No middle ear effusion. There is no impacted cerumen. Tympanic membrane is not erythematous.  ?   Nose: Congestion and rhinorrhea present.  ?   Mouth/Throat:  ?   Mouth: Mucous membranes are moist. No oral lesions.  ?   Pharynx: No pharyngeal swelling, oropharyngeal exudate, posterior oropharyngeal erythema or uvula swelling.  ?   Tonsils: No tonsillar exudate or tonsillar abscesses.  ?Eyes:  ?   General: Lids are everted, no foreign bodies appreciated. No scleral icterus.    ?   Right eye: Discharge present. No foreign body or hordeolum.     ?   Left eye: Discharge present.No foreign body or hordeolum.  ?   Extraocular Movements: Extraocular movements intact.  ?  Right eye: Normal extraocular motion.  ?   Left eye: Normal extraocular motion.  ?   Conjunctiva/sclera:  ?   Right eye: Right conjunctiva is injected. No chemosis, exudate or hemorrhage. ?   Left eye: Left conjunctiva is injected. No chemosis, exudate or hemorrhage. ?   Comments: Clear watery discharge from either eye.  ?Cardiovascular:  ?   Rate and Rhythm: Normal rate.  ?Pulmonary:  ?   Effort: Pulmonary effort is normal.  ?Musculoskeletal:  ?   Cervical back: Normal range of motion and neck supple.  ?Lymphadenopathy:  ?   Cervical: No cervical adenopathy.  ?Skin: ?   General: Skin is warm and dry.  ?Neurological:  ?    General: No focal deficit present.  ?   Mental Status: She is alert and oriented to person, place, and time.  ?Psychiatric:     ?   Mood and Affect: Mood normal.     ?   Behavior: Behavior normal.  ? ? ?Assessment and Plan :  ? ?PDMP not reviewed this encounter. ? ?1. Bacterial conjunctivitis of both eyes   ?2. Viral upper respiratory illness   ? ?Recommend tobramycin eyedrops for bacterial conjunctivitis of both eyes.  Use supportive care otherwise for viral URI. Counseled patient on potential for adverse effects with medications prescribed/recommended today, ER and return-to-clinic precautions discussed, patient verbalized understanding. ? ?  ?Wallis Bamberg, PA-C ?05/26/21 1616 ? ?

## 2021-05-26 NOTE — ED Triage Notes (Signed)
Pt reports eye drainage, redness and discomfort x 2 days.  ?

## 2021-05-26 NOTE — Discharge Instructions (Signed)
Start with tobramycin for your eyes at 1 drop every 2 hours to each eye. Do this for 48 hours and then switch to 1 drop every 4 hours to each eye for 1 week.  ?

## 2021-06-06 ENCOUNTER — Ambulatory Visit
Admission: RE | Admit: 2021-06-06 | Discharge: 2021-06-06 | Disposition: A | Discharge status: Home / Self Care | Payer: BC Managed Care – PPO | Source: Ambulatory Visit | Attending: Obstetrics and Gynecology | Admitting: Obstetrics and Gynecology

## 2021-06-06 DIAGNOSIS — Z1231 Encounter for screening mammogram for malignant neoplasm of breast: Secondary | ICD-10-CM

## 2022-05-08 ENCOUNTER — Other Ambulatory Visit: Payer: Self-pay | Admitting: Obstetrics and Gynecology

## 2022-05-08 DIAGNOSIS — Z1231 Encounter for screening mammogram for malignant neoplasm of breast: Secondary | ICD-10-CM

## 2022-06-09 ENCOUNTER — Ambulatory Visit
Admission: RE | Admit: 2022-06-09 | Discharge: 2022-06-09 | Disposition: A | Discharge status: Home / Self Care | Payer: BC Managed Care – PPO | Source: Ambulatory Visit | Attending: Obstetrics and Gynecology | Admitting: Obstetrics and Gynecology

## 2022-06-09 DIAGNOSIS — Z1231 Encounter for screening mammogram for malignant neoplasm of breast: Secondary | ICD-10-CM

## 2022-10-26 ENCOUNTER — Ambulatory Visit: Payer: 59 | Admitting: Podiatry

## 2022-11-02 ENCOUNTER — Ambulatory Visit: Payer: 59 | Admitting: Podiatry

## 2022-11-03 ENCOUNTER — Ambulatory Visit: Payer: 59 | Admitting: Podiatry

## 2022-11-13 ENCOUNTER — Ambulatory Visit: Payer: 59 | Admitting: Podiatry

## 2022-11-13 ENCOUNTER — Encounter: Payer: Self-pay | Admitting: Podiatry

## 2022-11-13 DIAGNOSIS — M216X9 Other acquired deformities of unspecified foot: Secondary | ICD-10-CM | POA: Diagnosis not present

## 2022-11-13 DIAGNOSIS — L84 Corns and callosities: Secondary | ICD-10-CM

## 2022-11-14 NOTE — Progress Notes (Signed)
Subjective:   Patient ID: Whitney Cook, female   DOB: 57 y.o.   MRN: [id number]   HPI Patient presents with painful calluses right over left foot history of structural bunion procedure done 8 9 years ago.  Patient is not currently smoking is active   Review of Systems  All other systems reviewed and are negative.       Objective:  Physical Exam Vitals and nursing note reviewed.  Constitutional:      Appearance: She is well-developed.  Pulmonary:     Effort: Pulmonary effort is normal.  Musculoskeletal:        General: Normal range of motion.  Skin:    General: Skin is warm.  Neurological:     Mental Status: She is alert.     Neurovascular status intact muscle strength adequate range of motion adequate keratotic lesion subsecond metatarsal right foot and submetatarsal left moderate discomfort with moderate plantarflexion     Assessment:  Pain secondary to probable bone structure     Plan:  Debridement accomplished this can be done most likely by pedicure and I explained doing this in the future.  Patient will be seen back if symptoms indicate may require elevating osteotomy if symptoms get worse

## 2023-04-24 NOTE — Progress Notes (Signed)
 This encounter was created in error - please disregard.

## 2023-04-24 NOTE — Progress Notes (Signed)
LASER

## 2023-05-11 ENCOUNTER — Other Ambulatory Visit: Payer: Self-pay | Admitting: Obstetrics and Gynecology

## 2023-05-11 DIAGNOSIS — Z1231 Encounter for screening mammogram for malignant neoplasm of breast: Secondary | ICD-10-CM

## 2023-06-13 ENCOUNTER — Ambulatory Visit: Payer: Self-pay

## 2023-06-25 ENCOUNTER — Ambulatory Visit
Admission: RE | Admit: 2023-06-25 | Discharge: 2023-06-25 | Disposition: A | Discharge status: Home / Self Care | Payer: Self-pay | Source: Ambulatory Visit | Attending: Obstetrics and Gynecology | Admitting: Obstetrics and Gynecology

## 2023-06-25 DIAGNOSIS — Z1231 Encounter for screening mammogram for malignant neoplasm of breast: Secondary | ICD-10-CM
